# Patient Record
Sex: Male | Born: 1990 | Race: Black or African American | Hispanic: No | Marital: Single | Smoking: Former smoker
Health system: Southern US, Community
[De-identification: ages and names within clinical notes are randomized; demographics above are authoritative.]

## PROBLEM LIST (undated history)

## (undated) ENCOUNTER — Emergency Department (HOSPITAL_COMMUNITY): Admission: EM | Payer: Self-pay | Source: Home / Self Care

## (undated) DIAGNOSIS — M62838 Other muscle spasm: Secondary | ICD-10-CM

## (undated) DIAGNOSIS — R1310 Dysphagia, unspecified: Secondary | ICD-10-CM

## (undated) DIAGNOSIS — J40 Bronchitis, not specified as acute or chronic: Secondary | ICD-10-CM

## (undated) DIAGNOSIS — M75100 Unspecified rotator cuff tear or rupture of unspecified shoulder, not specified as traumatic: Secondary | ICD-10-CM

## (undated) DIAGNOSIS — K219 Gastro-esophageal reflux disease without esophagitis: Secondary | ICD-10-CM

## (undated) DIAGNOSIS — D649 Anemia, unspecified: Secondary | ICD-10-CM

## (undated) DIAGNOSIS — F419 Anxiety disorder, unspecified: Secondary | ICD-10-CM

## (undated) HISTORY — DX: Dysphagia, unspecified: R13.10

## (undated) HISTORY — DX: Other muscle spasm: M62.838

## (undated) HISTORY — PX: COLONOSCOPY: SHX174

## (undated) HISTORY — DX: Anemia, unspecified: D64.9

## (undated) HISTORY — DX: Anxiety disorder, unspecified: F41.9

---

## 2014-05-04 ENCOUNTER — Encounter (HOSPITAL_COMMUNITY): Payer: Self-pay | Admitting: Emergency Medicine

## 2014-05-04 ENCOUNTER — Emergency Department (INDEPENDENT_AMBULATORY_CARE_PROVIDER_SITE_OTHER)
Admission: EM | Admit: 2014-05-04 | Discharge: 2014-05-04 | Discharge status: Against Medical Advice | Payer: Self-pay | Source: Home / Self Care | Attending: Emergency Medicine | Admitting: Emergency Medicine

## 2014-05-04 ENCOUNTER — Emergency Department (INDEPENDENT_AMBULATORY_CARE_PROVIDER_SITE_OTHER): Payer: Self-pay

## 2014-05-04 DIAGNOSIS — R0789 Other chest pain: Secondary | ICD-10-CM

## 2014-05-04 DIAGNOSIS — R42 Dizziness and giddiness: Secondary | ICD-10-CM

## 2014-05-04 DIAGNOSIS — R002 Palpitations: Secondary | ICD-10-CM

## 2014-05-04 NOTE — ED Provider Notes (Signed)
CSN: [id number]     Arrival date & time 05/04/14  [id number] History   First MD Initiated Contact with Patient 05/04/14 0957     Chief Complaint  Patient presents with  . Chest Pain   (Consider location/radiation/quality/duration/timing/severity/associated sxs/prior Treatment) HPI Comments: [age]m presents for eval of chest pain. He reports a history of chest pain for about one month. The pain is located across his left chest and is constant. It is worse with taking a deep breath. In the past month, he has also had associated palpitations, shortness of breath, and dizziness. The dizziness, palpitations, and chest pain seemed to all go together. The chest pain is nonexertional. He has no previous history of heart problems and no contributory family history. He reports a history of chronic bronchitis. He admits to increased stress in his life which he believes may be causing this.  Patient is a 23 y.o. male presenting with chest pain.  Chest Pain Associated symptoms: dizziness, palpitations and shortness of breath   Associated symptoms: no nausea and not vomiting     History reviewed. No pertinent past medical history. History reviewed. No pertinent past surgical history. History reviewed. No pertinent family history. History  Substance Use Topics  . Smoking status: Former Games developermoker  . Smokeless tobacco: Not on file  . Alcohol Use: Yes    Review of Systems  Respiratory: Positive for shortness of breath. Negative for chest tightness and wheezing.   Cardiovascular: Positive for chest pain and palpitations. Negative for leg swelling.  Gastrointestinal: Negative for nausea, vomiting and diarrhea.  Neurological: Positive for dizziness and light-headedness.  All other systems reviewed and are negative.   Allergies  Review of patient's allergies indicates no known allergies.  Home Medications   Prior to Admission medications   Not on File   BP 111/73  Pulse 65  Temp(Src) 98.3 F (36.8 C)  (Oral)  Resp 16  SpO2 100% Physical Exam  Nursing note and vitals reviewed. Constitutional: He is oriented to person, place, and time. He appears well-developed and well-nourished. No distress.  Physically fit  HENT:  Head: Normocephalic.  Neck: No JVD present. No tracheal deviation present.  Cardiovascular: Normal rate, regular rhythm and normal heart sounds.   Pulmonary/Chest: Effort normal and breath sounds normal. No respiratory distress.  Neurological: He is alert and oriented to person, place, and time. Coordination normal.  Skin: Skin is warm and dry. No rash noted. He is not diaphoretic.  Psychiatric: He has a normal mood and affect. Judgment normal.    ED Course  ED EKG  Date/Time: 05/04/2014 10:50 AM Performed by: Autumn MessingBAKER, Takela Varden, H Authorized by: Autumn MessingBAKER, Oneka Parada, H Rhythm: sinus rhythm Rate: bradycardic QRS axis: normal Conduction: conduction normal ST Segments: ST segments normal T Waves: T waves normal Other: no other findings Clinical impression: normal ECG   (including critical care time) Labs Review Labs Reviewed - No data to display  Imaging Review Dg Chest 2 View  05/04/2014   CLINICAL DATA:  Chest pain  EXAM: CHEST  2 VIEW  COMPARISON:  None.  FINDINGS: The heart size and mediastinal contours are within normal limits. Both lungs are clear. The visualized skeletal structures are unremarkable.  IMPRESSION: No active cardiopulmonary disease.   Electronically Signed   By: Ruel Favorsrevor  Shick M.D.   On: 05/04/2014 10:26     MDM   1. Other chest pain   2. Palpitations   3. Dizziness     Chest pain with palpitations, nonexertional. Nonradiating without any nausea.  It is associated with mild shortness of breath. Although this may be caused by stress, cannot rule out intermittent arrhythmia. He needs monitoring, he has no insurance and would have difficulty with outpatient followup. Transferred to the ED via shuttle.  Graylon Good, PA-C 05/04/14 1052  After  the patient agreed to be transferred to the emergency department for further evaluation and monitoring, he walked outside and left. He will be dispositioned as AMA.  Graylon Good, PA-C 05/04/14 1105

## 2014-05-04 NOTE — ED Notes (Signed)
Pt  Reports     intermittant   Chest  Pain      X  1  Month     -    Worse  Sometimes  When  He  Takes  A  Deep  Breath        He        Reports  Is  A  Smoker   Who  Has  Had   chronic  Bronchitis  In  Past

## 2014-05-04 NOTE — ED Notes (Signed)
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

## 2014-05-04 NOTE — ED Provider Notes (Signed)
Medical screening examination/treatment/procedure(s) were performed by non-physician practitioner and as supervising physician I was immediately available for consultation/collaboration.  Leslee Homeavid Whitlee Sluder, M.D.  Reuben Likesavid C Milo Schreier, MD 05/04/14 [phone]

## 2015-02-04 ENCOUNTER — Emergency Department (HOSPITAL_COMMUNITY): Payer: Self-pay

## 2015-02-04 ENCOUNTER — Encounter (HOSPITAL_COMMUNITY): Payer: Self-pay | Admitting: *Deleted

## 2015-02-04 ENCOUNTER — Emergency Department (HOSPITAL_COMMUNITY)
Admission: EM | Admit: 2015-02-04 | Discharge: 2015-02-04 | Disposition: A | Discharge status: Home / Self Care | Payer: Self-pay | Attending: Emergency Medicine | Admitting: Emergency Medicine

## 2015-02-04 DIAGNOSIS — R059 Cough, unspecified: Secondary | ICD-10-CM

## 2015-02-04 DIAGNOSIS — R0789 Other chest pain: Secondary | ICD-10-CM | POA: Insufficient documentation

## 2015-02-04 DIAGNOSIS — Z87891 Personal history of nicotine dependence: Secondary | ICD-10-CM | POA: Insufficient documentation

## 2015-02-04 DIAGNOSIS — R6883 Chills (without fever): Secondary | ICD-10-CM | POA: Insufficient documentation

## 2015-02-04 DIAGNOSIS — Z79899 Other long term (current) drug therapy: Secondary | ICD-10-CM | POA: Insufficient documentation

## 2015-02-04 DIAGNOSIS — R05 Cough: Secondary | ICD-10-CM | POA: Insufficient documentation

## 2015-02-04 DIAGNOSIS — R0981 Nasal congestion: Secondary | ICD-10-CM | POA: Insufficient documentation

## 2015-02-04 DIAGNOSIS — R0602 Shortness of breath: Secondary | ICD-10-CM | POA: Insufficient documentation

## 2015-02-04 MED ORDER — GUAIFENESIN ER 600 MG PO TB12: 1200.0000 mg | ORAL_TABLET | Freq: Two times a day (BID) | ORAL | Status: DC

## 2015-02-04 MED ORDER — NAPROXEN 500 MG PO TABS: 500.0000 mg | ORAL_TABLET | Freq: Two times a day (BID) | ORAL | Status: DC

## 2015-02-04 NOTE — ED Notes (Signed)
Pt in c/o cough and congestion for two weeks, cough is productive with yellow mucus, c/o body aches and chills, headaches, reports nausea and one episode of vomiting

## 2015-02-04 NOTE — Discharge Instructions (Signed)
Chest Pain (Nonspecific) °It is often hard to give a diagnosis for the cause of chest pain. There is always a chance that your pain could be related to something serious, such as a heart attack or a blood clot in the lungs. You need to follow up with your doctor. °HOME CARE °· If antibiotic medicine was given, take it as directed by your doctor. Finish the medicine even if you start to feel better. °· For the next few days, avoid activities that bring on chest pain. Continue physical activities as told by your doctor. °· Do not use any tobacco products. This includes cigarettes, chewing tobacco, and e-cigarettes. °· Avoid drinking alcohol. °· Only take medicine as told by your doctor. °· Follow your doctor's suggestions for more testing if your chest pain does not go away. °· Keep all doctor visits you made. °GET HELP IF: °· Your chest pain does not go away, even after treatment. °· You have a rash with blisters on your chest. °· You have a fever. °GET HELP RIGHT AWAY IF:  °· You have more pain or pain that spreads to your arm, neck, jaw, back, or belly (abdomen). °· You have shortness of breath. °· You cough more than usual or cough up blood. °· You have very bad back or belly pain. °· You feel sick to your stomach (nauseous) or throw up (vomit). °· You have very bad weakness. °· You pass out (faint). °· You have chills. °This is an emergency. Do not wait to see if the problems will go away. Call your local emergency services (911 in [place] Do not drive yourself to the hospital. °MAKE SURE YOU:  °· Understand these instructions. °· Will watch your condition. °· Will get help right away if you are not doing well or get worse. °Document Released: 02/18/2008 Document Revised: 09/06/2013 Document Reviewed: 02/18/2008 °ExitCare® Patient Information ©2015 [employer]. This information is not intended to replace advice given to you by your health care provider. Make sure you discuss any questions you have with your  health care provider. ° °Cough, Adult ° A cough is a reflex that helps clear your throat and airways. It can help heal the body or may be a reaction to an irritated airway. A cough may only last 2 or 3 weeks (acute) or may last more than 8 weeks (chronic).  °CAUSES °Acute cough: °· Viral or bacterial infections. °Chronic cough: °· Infections. °· Allergies. °· Asthma. °· Post-nasal drip. °· Smoking. °· Heartburn or acid reflux. °· Some medicines. °· Chronic lung problems (COPD). °· Cancer. °SYMPTOMS  °· Cough. °· Fever. °· Chest pain. °· Increased breathing rate. °· High-pitched whistling sound when breathing (wheezing). °· Colored mucus that you cough up (sputum). °TREATMENT  °· A bacterial cough may be treated with antibiotic medicine. °· A viral cough must run its course and will not respond to antibiotics. °· Your caregiver may recommend other treatments if you have a chronic cough. °HOME CARE INSTRUCTIONS  °· Only take over-the-counter or prescription medicines for pain, discomfort, or fever as directed by your caregiver. Use cough suppressants only as directed by your caregiver. °· Use a cold steam vaporizer or humidifier in your bedroom or home to help loosen secretions. °· Sleep in a semi-upright position if your cough is worse at night. °· Rest as needed. °· Stop smoking if you smoke. °SEEK IMMEDIATE MEDICAL CARE IF:  °· You have pus in your sputum. °· Your cough starts to worsen. °· You cannot   control your cough with suppressants and are losing sleep. °· You begin coughing up blood. °· You have difficulty breathing. °· You develop pain which is getting worse or is uncontrolled with medicine. °· You have a fever. °MAKE SURE YOU:  °· Understand these instructions. °· Will watch your condition. °· Will get help right away if you are not doing well or get worse. °Document Released: 02/28/2011 Document Revised: 11/24/2011 Document Reviewed: 02/28/2011 °ExitCare® Patient Information ©2015 [employer]. This  information is not intended to replace advice given to you by your health care provider. Make sure you discuss any questions you have with your health care provider. ° °

## 2015-02-04 NOTE — ED Provider Notes (Signed)
CSN: [id number]     Arrival date & time 02/04/15  0218 History   This chart was scribed for Marisa Severin, MD by Abel Presto, ED Scribe. This patient was seen in room D33C/D33C and the patient's care was started at 3:52 AM.    Chief Complaint  Patient presents with  . Cough    The history is provided by the patient. No language interpreter was used.   HPI Comments: Sean Park is a 24 y.o. male who presents to the Emergency Department complaining of intermittent productive cough with thick yellow sputum with onset 2 weeks ago. Pt notes associated chills, SOB and congestion. Pt has not taken any decongestants for relief. Pt also reports recurrent burning, aching, sharp central chest pain with onset 7 months ago. Pt reports frequent strenuous work. Pt reports h/o chronic bronchitis but states he has not used an inhaler in years. Pt is not a smoker. Pt denies fever.   History reviewed. No pertinent past medical history. History reviewed. No pertinent past surgical history. History reviewed. No pertinent family history. History  Substance Use Topics  . Smoking status: Former Games developer  . Smokeless tobacco: Not on file  . Alcohol Use: Yes    Review of Systems  Constitutional: Positive for chills. Negative for fever.  HENT: Positive for congestion. Negative for sore throat.   Respiratory: Positive for cough and shortness of breath.   Cardiovascular: Positive for chest pain.     Allergies  Review of patient's allergies indicates no known allergies.  Home Medications   Prior to Admission medications   Medication Sig Start Date End Date Taking? Authorizing Provider  Ascorbic Acid (VITAMIN C PO) Take 1 tablet by mouth daily.   Yes Historical Provider, MD  COD LIVER OIL PO Take 1 capsule by mouth daily.   Yes Historical Provider, MD   BP 113/65 mmHg  Pulse 59  Temp(Src) 99.4 F (37.4 C) (Oral)  Resp 17  Wt 160 lb (72.576 kg)  SpO2 100% Physical Exam  Constitutional: He is  oriented to person, place, and time. He appears well-developed and well-nourished.  HENT:  Head: Normocephalic and atraumatic.  Nose: Nose normal.  Mouth/Throat: Oropharynx is clear and moist.  Eyes: Conjunctivae and EOM are normal. Pupils are equal, round, and reactive to light.  Neck: Normal range of motion. Neck supple. No JVD present. No tracheal deviation present. No thyromegaly present.  Cardiovascular: Normal rate, regular rhythm, normal heart sounds and intact distal pulses.  Exam reveals no gallop and no friction rub.   No murmur heard. Pulmonary/Chest: Effort normal and breath sounds normal. No stridor. No respiratory distress. He has no wheezes. He has no rales. He exhibits no tenderness.  Abdominal: Soft. Bowel sounds are normal. He exhibits no distension and no mass. There is no tenderness. There is no rebound and no guarding.  Musculoskeletal: Normal range of motion. He exhibits no edema or tenderness.  Lymphadenopathy:    He has no cervical adenopathy.  Neurological: He is alert and oriented to person, place, and time. He displays normal reflexes. He exhibits normal muscle tone. Coordination normal.  Skin: Skin is warm and dry. No rash noted. No erythema. No pallor.  Psychiatric: He has a normal mood and affect. His behavior is normal. Judgment and thought content normal.  Nursing note and vitals reviewed.   ED Course  Procedures (including critical care time) DIAGNOSTIC STUDIES: Oxygen Saturation is 100% on room air, normal by my interpretation.    COORDINATION OF CARE: 4:01  AM Discussed treatment plan with patient at beside, the patient agrees with the plan and has no further questions at this time.   Labs Review Labs Reviewed - No data to display  Imaging Review Dg Chest 2 View  02/04/2015   CLINICAL DATA:  Cough, shortness of breath and chest pain radiating to the left arm.  EXAM: CHEST  2 VIEW  COMPARISON:  05/04/2014  FINDINGS: The cardiomediastinal contours are  normal. The lungs are clear. Pulmonary vasculature is normal. No consolidation, pleural effusion, or pneumothorax. No acute osseous abnormalities are seen.  IMPRESSION: No acute pulmonary process.   Electronically Signed   By: Rubye OaksMelanie  Ehinger M.D.   On: 02/04/2015 04:20     EKG Interpretation None      MDM   Final diagnoses:  Cough  Other chest pain   I personally performed the services described in this documentation, which was scribed in my presence. The recorded information has been reviewed and is accurate.  24 year old male with 2 weeks of cough, subjective fever and chills.  Also complains of 7 months of chest pain.  No chest pain currently.  Chest x-ray unremarkable.  We'll start him on a decongestant and NSAID for pain.    Marisa Severinlga Rivka Baune, MD 02/04/15 [phone]

## 2015-02-16 ENCOUNTER — Emergency Department (HOSPITAL_COMMUNITY): Payer: Self-pay

## 2015-02-16 ENCOUNTER — Encounter (HOSPITAL_COMMUNITY): Payer: Self-pay | Admitting: *Deleted

## 2015-02-16 DIAGNOSIS — Z79899 Other long term (current) drug therapy: Secondary | ICD-10-CM | POA: Insufficient documentation

## 2015-02-16 DIAGNOSIS — R079 Chest pain, unspecified: Secondary | ICD-10-CM | POA: Insufficient documentation

## 2015-02-16 DIAGNOSIS — Z87891 Personal history of nicotine dependence: Secondary | ICD-10-CM | POA: Insufficient documentation

## 2015-02-16 DIAGNOSIS — R05 Cough: Secondary | ICD-10-CM | POA: Insufficient documentation

## 2015-02-16 DIAGNOSIS — R0602 Shortness of breath: Secondary | ICD-10-CM | POA: Insufficient documentation

## 2015-02-16 DIAGNOSIS — K5900 Constipation, unspecified: Secondary | ICD-10-CM | POA: Insufficient documentation

## 2015-02-16 DIAGNOSIS — Z8709 Personal history of other diseases of the respiratory system: Secondary | ICD-10-CM | POA: Insufficient documentation

## 2015-02-16 LAB — CBC WITH DIFFERENTIAL/PLATELET
Basophils Absolute: 0 10*3/uL (ref 0.0–0.1)
Basophils Relative: 1 % (ref 0–1)
EOS ABS: 0.3 10*3/uL (ref 0.0–0.7)
EOS PCT: 7 % — AB (ref 0–5)
HCT: 39.4 % (ref 39.0–52.0)
HEMOGLOBIN: 12.4 g/dL — AB (ref 13.0–17.0)
LYMPHS PCT: 48 % — AB (ref 12–46)
Lymphs Abs: 2.4 10*3/uL (ref 0.7–4.0)
MCH: 23.8 pg — AB (ref 26.0–34.0)
MCHC: 31.5 g/dL (ref 30.0–36.0)
MCV: 75.5 fL — ABNORMAL LOW (ref 78.0–100.0)
MONO ABS: 0.4 10*3/uL (ref 0.1–1.0)
MONOS PCT: 8 % (ref 3–12)
NEUTROS ABS: 1.8 10*3/uL (ref 1.7–7.7)
Neutrophils Relative %: 36 % — ABNORMAL LOW (ref 43–77)
PLATELETS: 188 10*3/uL (ref 150–400)
RBC: 5.22 MIL/uL (ref 4.22–5.81)
RDW: 12.8 % (ref 11.5–15.5)
WBC: 4.9 10*3/uL (ref 4.0–10.5)

## 2015-02-16 LAB — COMPREHENSIVE METABOLIC PANEL
ALK PHOS: 47 U/L (ref 38–126)
ALT: 16 U/L — ABNORMAL LOW (ref 17–63)
AST: 21 U/L (ref 15–41)
Albumin: 3.8 g/dL (ref 3.5–5.0)
Anion gap: 8 (ref 5–15)
BUN: 8 mg/dL (ref 6–20)
CO2: 29 mmol/L (ref 22–32)
CREATININE: 0.92 mg/dL (ref 0.61–1.24)
Calcium: 9 mg/dL (ref 8.9–10.3)
Chloride: 103 mmol/L (ref 101–111)
GFR calc Af Amer: >60 mL/min (ref >60)
GFR calc non Af Amer: >60 mL/min (ref >60)
Glucose, Bld: 93 mg/dL (ref 65–99)
Potassium: 3.7 mmol/L (ref 3.5–5.1)
Sodium: 140 mmol/L (ref 135–145)
Total Bilirubin: 0.5 mg/dL (ref 0.3–1.2)
Total Protein: 6.6 g/dL (ref 6.5–8.1)

## 2015-02-16 NOTE — ED Notes (Signed)
The pt is c/o chest pain he has had no bm for one week.  He describes the pain as a burning sensation in his chest

## 2015-02-17 ENCOUNTER — Emergency Department (HOSPITAL_COMMUNITY)
Admission: EM | Admit: 2015-02-17 | Discharge: 2015-02-17 | Disposition: A | Discharge status: Home / Self Care | Payer: Self-pay | Attending: Emergency Medicine | Admitting: Emergency Medicine

## 2015-02-17 DIAGNOSIS — R079 Chest pain, unspecified: Secondary | ICD-10-CM

## 2015-02-17 DIAGNOSIS — K5900 Constipation, unspecified: Secondary | ICD-10-CM

## 2015-02-17 MED ORDER — POLYETHYLENE GLYCOL 3350 17 G PO PACK: 17.0000 g | PACK | Freq: Every day | ORAL | Status: DC

## 2015-02-17 MED ORDER — GI COCKTAIL ~~LOC~~
30.0000 mL | Freq: Once | ORAL | Status: AC
  Administered 2015-02-17: 30 mL via ORAL
  Filled 2015-02-17: qty 30

## 2015-02-17 NOTE — ED Notes (Signed)
Pt c/o generalized chest pain for several days. Reports last normal bowel movement five days ago; constipation unrelieved with OTC medications and increased fluid intake. Denies shortness of breath

## 2015-02-17 NOTE — ED Provider Notes (Signed)
CSN: [id number]     Arrival date & time 02/16/15  2213 History  This chart was scribed for Sean Rhine, MD by Abel Presto, ED Scribe. This patient was seen in room A09C/A09C and the patient's care was started at 1:37 AM.    Chief Complaint  Patient presents with  . Chest Pain    Patient is a 24 y.o. male presenting with chest pain. The history is provided by the patient. No language interpreter was used.  Chest Pain Pain location:  Substernal area Pain quality: burning   Pain radiates to:  Does not radiate Pain radiates to the back: no   Pain severity:  Moderate Onset quality:  Sudden Duration:  1 day Timing:  Constant Progression:  Worsening Chronicity:  New Context: eating   Relieved by:  Nothing Associated symptoms: abdominal pain, cough and shortness of breath   Associated symptoms: no fever and not vomiting   Abdominal pain:    Location:  Generalized   Quality:  Burning   Severity:  Moderate   Onset quality:  Gradual   Duration:  1 week   Timing:  Intermittent   Progression:  Worsening   Chronicity:  New Risk factors: no prior DVT/PE    HPI Comments: Altair Appenzeller is a 24 y.o. male who presents to the Emergency Department complaining of upper chest pain with onset today. Pt notes eating aggravates the pain. He states he feels like gas is trapped in his chest. Pt reports assocaited burping with production of "brown chunks", burning abdominal pain, constipation for 1 week, and mild SOB secondary to pain. Pt has tried stool softener and GasX for relief. Pt reports h/o GERD for which he takes omeprazole. Pt also presents with mild cough.  Pt was last seen in ED for chest pain and cough on 02/04/15. Pt was d/c with Rx for guaifenesin and naproxen. Pt denies recent prolonged travel or surgery. Pt denies PMHx of DVT/ PE. Pt denies fever, hematochezia, bilateral LE swelling and pain, vomiting, and changes in flatulence.   PMH - none History  Substance Use Topics  . Smoking  status: Former Games developer  . Smokeless tobacco: Not on file  . Alcohol Use: Yes    Review of Systems  Constitutional: Negative for fever.  Respiratory: Positive for cough and shortness of breath.   Cardiovascular: Positive for chest pain. Negative for leg swelling.  Gastrointestinal: Positive for abdominal pain and constipation. Negative for vomiting and blood in stool.  All other systems reviewed and are negative.     Allergies  Review of patient's allergies indicates no known allergies.  Home Medications   Prior to Admission medications   Medication Sig Start Date End Date Taking? Authorizing Provider  Ascorbic Acid (VITAMIN C PO) Take 1 tablet by mouth daily.    Historical Provider, MD  COD LIVER OIL PO Take 1 capsule by mouth daily.    Historical Provider, MD  polyethylene glycol (MIRALAX / GLYCOLAX) packet Take 17 g by mouth daily. 02/17/15   Sean Rhine, MD   BP 104/60 mmHg  Pulse 74  Temp(Src) 98.6 F (37 C) (Oral)  Resp 22  Ht 6' (1.829 m)  Wt 145 lb (65.772 kg)  BMI 19.66 kg/m2  SpO2 99% Physical Exam  Nursing note and vitals reviewed.  CONSTITUTIONAL: Well developed/well nourished HEAD: Normocephalic/atraumatic EYES: EOMI/PERRL ENMT: Mucous membranes moist NECK: supple no meningeal signs SPINE/BACK:entire spine nontender CV: S1/S2 noted, no murmurs/rubs/gallops noted LUNGS: Lungs are clear to auscultation bilaterally, no apparent distress  ABDOMEN: soft, nontender, no rebound or guarding, bowel sounds noted throughout abdomen GU:no cva tenderness NEURO: Pt is awake/alert/appropriate, moves all extremitiesx4.  No facial droop.   EXTREMITIES: pulses normal/equal, full ROM; no edema or calf tenderness noted SKIN: warm, color normal PSYCH: no abnormalities of mood noted, alert and oriented to situation   ED Course  Procedures  Pt without signs of bowel obstruction No focal abd tenderness He reports significant constipation,l ordered miralax For CP - seems  related to eating Low suspicion for ACS/Pe/Dissection at this time  Medications  gi cocktail (Maalox,Lidocaine,Donnatal) (30 mLs Oral Given 02/17/15 0151)    DIAGNOSTIC STUDIES: Oxygen Saturation is 99% on room air, normal by my interpretation.    COORDINATION OF CARE: 1:46 AM Discussed treatment plan with patient at beside, the patient agrees with the plan and has no further questions at this time.   Labs Review Labs Reviewed  CBC WITH DIFFERENTIAL/PLATELET - Abnormal; Notable for the following:    Hemoglobin 12.4 (*)    MCV 75.5 (*)    MCH 23.8 (*)    Neutrophils Relative % 36 (*)    Lymphocytes Relative 48 (*)    Eosinophils Relative 7 (*)    All other components within normal limits  COMPREHENSIVE METABOLIC PANEL - Abnormal; Notable for the following:    ALT 16 (*)    All other components within normal limits    Imaging Review Dg Chest 2 View  02/16/2015   CLINICAL DATA:  Chest pain and shortness of breath. Symptoms for 1 week.  EXAM: CHEST  2 VIEW  COMPARISON:  02/04/2015  FINDINGS: The cardiomediastinal contours are normal. The lungs are clear. Pulmonary vasculature is normal. No consolidation, pleural effusion, or pneumothorax. No acute osseous abnormalities are seen.  IMPRESSION: No acute pulmonary process.  No change from prior exam.   Electronically Signed   By: Rubye OaksMelanie  Ehinger M.D.   On: 02/16/2015 23:20     EKG Interpretation   Date/Time:  Friday February 16 2015 22:26:19 EDT Ventricular Rate:  55 PR Interval:  172 QRS Duration: 82 QT Interval:  426 QTC Calculation: 407 R Axis:   77 Text Interpretation:  Sinus bradycardia Otherwise normal ECG No  significant change was found Confirmed by CAMPOS  MD, Caryn BeeKEVIN ([id number]) on  02/16/2015 11:54:36 PM      MDM   Final diagnoses:  Chest pain, unspecified chest pain type  Constipation, unspecified constipation type    Nursing notes including past medical history and social history reviewed and considered in  documentation xrays/imaging reviewed by myself and considered during evaluation  I personally performed the services described in this documentation, which was scribed in my presence. The recorded information has been reviewed and is accurate.        Sean Rhineonald Laranda Burkemper, MD 02/17/15 [phone]

## 2015-02-17 NOTE — Discharge Instructions (Signed)

## 2015-02-19 ENCOUNTER — Encounter (HOSPITAL_COMMUNITY): Payer: Self-pay

## 2015-02-19 ENCOUNTER — Emergency Department (HOSPITAL_COMMUNITY)
Admission: EM | Admit: 2015-02-19 | Discharge: 2015-02-19 | Disposition: A | Discharge status: Home / Self Care | Payer: Self-pay | Attending: Emergency Medicine | Admitting: Emergency Medicine

## 2015-02-19 ENCOUNTER — Emergency Department (HOSPITAL_COMMUNITY): Payer: Self-pay

## 2015-02-19 DIAGNOSIS — J4521 Mild intermittent asthma with (acute) exacerbation: Secondary | ICD-10-CM | POA: Insufficient documentation

## 2015-02-19 DIAGNOSIS — K219 Gastro-esophageal reflux disease without esophagitis: Secondary | ICD-10-CM | POA: Insufficient documentation

## 2015-02-19 DIAGNOSIS — Z87891 Personal history of nicotine dependence: Secondary | ICD-10-CM | POA: Insufficient documentation

## 2015-02-19 DIAGNOSIS — J4520 Mild intermittent asthma, uncomplicated: Secondary | ICD-10-CM

## 2015-02-19 DIAGNOSIS — Z79899 Other long term (current) drug therapy: Secondary | ICD-10-CM | POA: Insufficient documentation

## 2015-02-19 HISTORY — DX: Bronchitis, not specified as acute or chronic: J40

## 2015-02-19 LAB — CBC
HCT: 39.5 % (ref 39.0–52.0)
Hemoglobin: 12.9 g/dL — ABNORMAL LOW (ref 13.0–17.0)
MCH: 24.3 pg — ABNORMAL LOW (ref 26.0–34.0)
MCHC: 32.7 g/dL (ref 30.0–36.0)
MCV: 74.4 fL — AB (ref 78.0–100.0)
Platelets: 183 10*3/uL (ref 150–400)
RBC: 5.31 MIL/uL (ref 4.22–5.81)
RDW: 12.8 % (ref 11.5–15.5)
WBC: 5.7 10*3/uL (ref 4.0–10.5)

## 2015-02-19 LAB — BASIC METABOLIC PANEL
ANION GAP: 8 (ref 5–15)
BUN: 7 mg/dL (ref 6–20)
CO2: 25 mmol/L (ref 22–32)
Calcium: 8.8 mg/dL — ABNORMAL LOW (ref 8.9–10.3)
Chloride: 104 mmol/L (ref 101–111)
Creatinine, Ser: 0.97 mg/dL (ref 0.61–1.24)
GFR calc Af Amer: >60 mL/min (ref >60)
GFR calc non Af Amer: >60 mL/min (ref >60)
GLUCOSE: 102 mg/dL — AB (ref 65–99)
Potassium: 3.4 mmol/L — ABNORMAL LOW (ref 3.5–5.1)
SODIUM: 137 mmol/L (ref 135–145)

## 2015-02-19 LAB — I-STAT TROPONIN, ED: Troponin i, poc: 0.01 ng/mL (ref 0.00–0.08)

## 2015-02-19 MED ORDER — ALBUTEROL SULFATE HFA 108 (90 BASE) MCG/ACT IN AERS
2.0000 | INHALATION_SPRAY | RESPIRATORY_TRACT | Status: DC | PRN
  Administered 2015-02-19: 2 via RESPIRATORY_TRACT
  Filled 2015-02-19: qty 6.7

## 2015-02-19 MED ORDER — SUCRALFATE 1 G PO TABS: 1.0000 g | ORAL_TABLET | Freq: Three times a day (TID) | ORAL | Status: DC

## 2015-02-19 MED ORDER — IPRATROPIUM-ALBUTEROL 0.5-2.5 (3) MG/3ML IN SOLN: 3.0000 mL | Freq: Once | RESPIRATORY_TRACT | Status: DC

## 2015-02-19 MED ORDER — GI COCKTAIL ~~LOC~~
30.0000 mL | Freq: Once | ORAL | Status: AC
  Administered 2015-02-19: 30 mL via ORAL
  Filled 2015-02-19: qty 30

## 2015-02-19 NOTE — Discharge Instructions (Signed)
Asthma °Asthma is a condition of the lungs in which the airways tighten and narrow. Asthma can make it hard to breathe. Asthma cannot be cured, but medicine and lifestyle changes can help control it. Asthma may be started (triggered) by: °· Animal skin flakes (dander). °· Dust. °· Cockroaches. °· Pollen. °· Mold. °· Smoke. °· Cleaning products. °· Hair sprays or aerosol sprays. °· Paint fumes or strong smells. °· Cold air, weather changes, and winds. °· Crying or laughing hard. °· Stress. °· Certain medicines or drugs. °· Foods, such as dried fruit, potato chips, and sparkling grape juice. °· Infections or conditions (colds, flu). °· Exercise. °· Certain medical conditions or diseases. °· Exercise or tiring activities. °HOME CARE  °· Take medicine as told by your doctor. °· Use a peak flow meter as told by your doctor. A peak flow meter is a tool that measures how well the lungs are working. °· Record and keep track of the peak flow meter's readings. °· Understand and use the asthma action plan. An asthma action plan is a written plan for taking care of your asthma and treating your attacks. °· To help prevent asthma attacks: °¨ Do not smoke. Stay away from secondhand smoke. °¨ Change your heating and air conditioning filter often. °¨ Limit your use of fireplaces and wood stoves. °¨ Get rid of pests (such as roaches and mice) and their droppings. °¨ Throw away plants if you see mold on them. °¨ Clean your floors. Dust regularly. Use cleaning products that do not smell. °¨ Have someone vacuum when you are not home. Use a vacuum cleaner with a HEPA filter if possible. °¨ Replace carpet with wood, tile, or vinyl flooring. Carpet can trap animal skin flakes and dust. °¨ Use allergy-proof pillows, mattress covers, and box spring covers. °¨ Wash bed sheets and blankets every week in hot water and dry them in a dryer. °¨ Use blankets that are made of polyester or cotton. °¨ Clean bathrooms and kitchens with bleach. If  possible, have someone repaint the walls in these rooms with mold-resistant paint. Keep out of the rooms that are being cleaned and painted. °¨ Wash hands often. °GET HELP IF: °· You have make a whistling sound when breaking (wheeze), have shortness of breath, or have a cough even if taking medicine to prevent attacks. °· The colored mucus you cough up (sputum) is thicker than usual. °· The colored mucus you cough up changes from clear or white to yellow, green, gray, or bloody. °· You have problems from the medicine you are taking such as: °¨ A rash. °¨ Itching. °¨ Swelling. °¨ Trouble breathing. °· You need reliever medicines more than 2-3 times a week. °· Your peak flow measurement is still at 50-79% of your personal best after following the action plan for 1 hour. °· You have a fever. °GET HELP RIGHT AWAY IF:  °· You seem to be worse and are not responding to medicine during an asthma attack. °· You are short of breath even at rest. °· You get short of breath when doing very little activity. °· You have trouble eating, drinking, or talking. °· You have chest pain. °· You have a fast heartbeat. °· Your lips or fingernails start to turn blue. °· You are light-headed, dizzy, or faint. °· Your peak flow is less than 50% of your personal best. °MAKE SURE YOU:  °· Understand these instructions. °· Will watch your condition. °· Will get help right away if you   are not doing well or get worse. Document Released: 02/18/2008 Document Revised: 01/16/2014 Document Reviewed: 03/31/2013 [facility] Patient Information 2015 [place], [place]. This information is not intended to replace advice given to you by your health care provider. Make sure you discuss any questions you have with your health care provider. Been provided with an inhaler uses as needed.  You've also been given a prescription for medication called Carafate.  Please take this as directed.  30 minutes before meals and 30 minutes before bedtime.  You've also been  given a referral to gastroenterology.  Please call and make an appointment for further evaluation

## 2015-02-19 NOTE — ED Provider Notes (Signed)
CSN: [id number]     Arrival date & time 02/19/15  0329 History   First MD Initiated Contact with Patient 02/19/15 0404     Chief Complaint  Patient presents with  . Chest Pain     (Consider location/radiation/quality/duration/timing/severity/associated sxs/prior Treatment) HPI Comments: This is a 24 year old male who spends seen several times in the emergency department for chest pain that wakes him from sleep and severe in nature.  It's a burning, per his description.  He told me he has no history of gastric reflux, but he is been prescribed Prilosec in the past.  He states it feels like gas is trapped his epigastric tenderness and burning sensation that comes up into his throat.  Patient is a 24 y.o. male presenting with chest pain. The history is provided by the patient.  Chest Pain Pain location:  Substernal area Pain quality: aching and burning   Pain radiates to the back: no   Pain severity:  Severe Onset quality:  Sudden Timing:  Constant Progression:  Worsening Chronicity:  Recurrent Context: at rest   Relieved by:  Nothing Worsened by:  Nothing tried Ineffective treatments:  None tried Associated symptoms: abdominal pain, anxiety, heartburn and shortness of breath   Associated symptoms: no cough, no diaphoresis, no dysphagia, no fever, no lower extremity edema, no nausea, no near-syncope, not vomiting and no weakness   Abdominal pain:    Location:  Epigastric   Quality:  Burning and bloating   Severity:  Severe   Chronicity:  Recurrent Risk factors: male sex     Past Medical History  Diagnosis Date  . Bronchitis    History reviewed. No pertinent past surgical history. History reviewed. No pertinent family history. History  Substance Use Topics  . Smoking status: Former Games developermoker  . Smokeless tobacco: Not on file  . Alcohol Use: Yes     Comment: former    Review of Systems  Constitutional: Negative for fever and diaphoresis.  HENT: Negative for trouble  swallowing.   Respiratory: Positive for chest tightness and shortness of breath. Negative for cough.   Cardiovascular: Positive for chest pain. Negative for near-syncope.  Gastrointestinal: Positive for heartburn and abdominal pain. Negative for nausea, vomiting, constipation and abdominal distention.  Neurological: Negative for weakness.      Allergies  Review of patient's allergies indicates no known allergies.  Home Medications   Prior to Admission medications   Medication Sig Start Date End Date Taking? Authorizing Provider  Ascorbic Acid (VITAMIN C PO) Take 1 tablet by mouth daily.    Historical Provider, MD  COD LIVER OIL PO Take 1 capsule by mouth daily.    Historical Provider, MD  polyethylene glycol (MIRALAX / GLYCOLAX) packet Take 17 g by mouth daily. 02/17/15   Zadie Rhineonald Wickline, MD  sucralfate (CARAFATE) 1 G tablet Take 1 tablet (1 g total) by mouth 4 (four) times daily -  with meals and at bedtime. 02/19/15   Earley FavorGail Meenakshi Sazama, NP   BP 126/73 mmHg  Pulse 55  Temp(Src) 97.9 F (36.6 C) (Oral)  Resp 18  Ht 6' (1.829 m)  Wt 145 lb (65.772 kg)  BMI 19.66 kg/m2  SpO2 98% Physical Exam  Constitutional: He is oriented to person, place, and time. He appears well-developed and well-nourished. He appears distressed.  HENT:  Head: Normocephalic.  Right Ear: External ear normal.  Left Ear: External ear normal.  Eyes: Pupils are equal, round, and reactive to light.  Neck: Normal range of motion.  Cardiovascular: Normal  rate and regular rhythm.   Pulmonary/Chest: Effort normal and breath sounds normal.  Abdominal: Soft. There is tenderness in the epigastric area. There is no rigidity and no guarding.  Musculoskeletal: Normal range of motion.  Neurological: He is alert and oriented to person, place, and time.  Skin: Skin is warm and dry.  Nursing note and vitals reviewed.   ED Course  Procedures (including critical care time) Labs Review Labs Reviewed  BASIC METABOLIC PANEL -  Abnormal; Notable for the following:    Potassium 3.4 (*)    Glucose, Bld 102 (*)    Calcium 8.8 (*)    All other components within normal limits  CBC - Abnormal; Notable for the following:    Hemoglobin 12.9 (*)    MCV 74.4 (*)    MCH 24.3 (*)    All other components within normal limits  I-STAT TROPOININ, ED    Imaging Review Dg Chest 2 View (if Patient Has Fever And/or Copd)  02/19/2015   CLINICAL DATA:  Chest pain, onset tonight.  EXAM: CHEST  2 VIEW  COMPARISON:  02/16/2015  FINDINGS: The cardiomediastinal contours are normal. Mild hyperinflation, increased from prior. Pulmonary vasculature is normal. No consolidation, pleural effusion, or pneumothorax. No acute osseous abnormalities are seen.  IMPRESSION: Mild hyperinflation, can be seen with bronchitis or asthma. No localizing process.   Electronically Signed   By: Rubye Oaks M.D.   On: 02/19/2015 04:31     EKG Interpretation None     Reviewed labs from 63 without any change.  I will obtain labs tonight for comparison, although I do not believe that this is gallbladder disease nor cardiac disease, but most likely, GERD complicated by  anxiety MDM   Final diagnoses:  Gastroesophageal reflux disease, esophagitis presence not specified  Asthma, mild intermittent, uncomplicated         Earley Favor, NP 02/19/15 2004  Marisa Severin, MD 02/20/15 1435

## 2015-02-19 NOTE — ED Notes (Signed)
Pt stable, ambulatory, states understanding of discharge instructions, family at bedside. 

## 2015-02-19 NOTE — ED Notes (Signed)
Pt here for chest pain, onset tonight, pt reports feels like trapped gas and recent issues with constipation. sts work him from sleep.

## 2015-02-27 ENCOUNTER — Emergency Department (HOSPITAL_COMMUNITY): Payer: Self-pay

## 2015-02-27 ENCOUNTER — Emergency Department (HOSPITAL_COMMUNITY)
Admission: EM | Admit: 2015-02-27 | Discharge: 2015-02-27 | Disposition: A | Discharge status: Home / Self Care | Payer: Self-pay | Attending: Emergency Medicine | Admitting: Emergency Medicine

## 2015-02-27 ENCOUNTER — Encounter (HOSPITAL_COMMUNITY): Payer: Self-pay

## 2015-02-27 DIAGNOSIS — F419 Anxiety disorder, unspecified: Secondary | ICD-10-CM | POA: Insufficient documentation

## 2015-02-27 DIAGNOSIS — R0602 Shortness of breath: Secondary | ICD-10-CM | POA: Insufficient documentation

## 2015-02-27 DIAGNOSIS — Z87891 Personal history of nicotine dependence: Secondary | ICD-10-CM | POA: Insufficient documentation

## 2015-02-27 DIAGNOSIS — R0789 Other chest pain: Secondary | ICD-10-CM | POA: Insufficient documentation

## 2015-02-27 DIAGNOSIS — K219 Gastro-esophageal reflux disease without esophagitis: Secondary | ICD-10-CM | POA: Insufficient documentation

## 2015-02-27 DIAGNOSIS — Z79899 Other long term (current) drug therapy: Secondary | ICD-10-CM | POA: Insufficient documentation

## 2015-02-27 DIAGNOSIS — Z8709 Personal history of other diseases of the respiratory system: Secondary | ICD-10-CM | POA: Insufficient documentation

## 2015-02-27 HISTORY — DX: Gastro-esophageal reflux disease without esophagitis: K21.9

## 2015-02-27 LAB — BASIC METABOLIC PANEL
ANION GAP: 7 (ref 5–15)
BUN: 14 mg/dL (ref 6–20)
CHLORIDE: 103 mmol/L (ref 101–111)
CO2: 28 mmol/L (ref 22–32)
Calcium: 9.2 mg/dL (ref 8.9–10.3)
Creatinine, Ser: 1.15 mg/dL (ref 0.61–1.24)
GFR calc Af Amer: >60 mL/min (ref >60)
GFR calc non Af Amer: >60 mL/min (ref >60)
Glucose, Bld: 95 mg/dL (ref 65–99)
POTASSIUM: 3.7 mmol/L (ref 3.5–5.1)
Sodium: 138 mmol/L (ref 135–145)

## 2015-02-27 LAB — CBC
HCT: 40.7 % (ref 39.0–52.0)
Hemoglobin: 13.1 g/dL (ref 13.0–17.0)
MCH: 24.2 pg — ABNORMAL LOW (ref 26.0–34.0)
MCHC: 32.2 g/dL (ref 30.0–36.0)
MCV: 75.2 fL — AB (ref 78.0–100.0)
Platelets: 216 10*3/uL (ref 150–400)
RBC: 5.41 MIL/uL (ref 4.22–5.81)
RDW: 12.9 % (ref 11.5–15.5)
WBC: 4.7 10*3/uL (ref 4.0–10.5)

## 2015-02-27 LAB — D-DIMER, QUANTITATIVE: D-Dimer, Quant: <0.27 ug/mL-FEU (ref 0.00–0.48)

## 2015-02-27 LAB — I-STAT TROPONIN, ED: TROPONIN I, POC: 0 ng/mL (ref 0.00–0.08)

## 2015-02-27 MED ORDER — CYCLOBENZAPRINE HCL 5 MG PO TABS: 5.0000 mg | ORAL_TABLET | Freq: Three times a day (TID) | ORAL | Status: DC | PRN

## 2015-02-27 MED ORDER — CYCLOBENZAPRINE HCL 10 MG PO TABS
5.0000 mg | ORAL_TABLET | Freq: Once | ORAL | Status: AC
  Administered 2015-02-27: 5 mg via ORAL
  Filled 2015-02-27: qty 1

## 2015-02-27 MED ORDER — ESOMEPRAZOLE MAGNESIUM 40 MG PO CPDR
40.0000 mg | DELAYED_RELEASE_CAPSULE | Freq: Every day | ORAL | Status: DC
Start: 2015-02-27 — End: 2015-04-13

## 2015-02-27 NOTE — Discharge Instructions (Signed)
Continue sucralfate.   Take nexium daily as well.   Take tylenol for pain.   Take flexeril for muscle spasms. Do NOT drive with it.  [facility]   Return to ER if you have severe chest pain, anxiety, shortness of breath.
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
## 2015-02-27 NOTE — ED Provider Notes (Signed)
CSN: [id number]     Arrival date & time 02/27/15  1523 History   First MD Initiated Contact with Patient 02/27/15 1810     Chief Complaint  Patient presents with  . Chest Pain     (Consider location/radiation/quality/duration/timing/severity/associated sxs/prior Treatment) The history is provided by the patient.  Storme Lien is a 24 y.o. male hx of GERD here with chest pain. Patient has been having intermittent chest pain for the last several weeks. Today the chest pain got suddenly worse and had some left arm pain. Has some shortness of breath associated with it as well. Denies any abdominal pain. Of note patient hasn't seen in the ED several times for this problem and was diagnosed with reflux. He is taking Carafate currently but is not helping. Patient has been very anxious recently but denies thoughts of harming himself or others. He states that he has been under a lot of stress.   Past Medical History  Diagnosis Date  . Bronchitis   . GERD (gastroesophageal reflux disease)    History reviewed. No pertinent past surgical history. History reviewed. No pertinent family history. History  Substance Use Topics  . Smoking status: Former Games developer  . Smokeless tobacco: Not on file  . Alcohol Use: No    Review of Systems  Respiratory: Positive for shortness of breath.   Cardiovascular: Positive for chest pain.  All other systems reviewed and are negative.     Allergies  Review of patient's allergies indicates no known allergies.  Home Medications   Prior to Admission medications   Medication Sig Start Date End Date Taking? Authorizing Provider  albuterol (PROVENTIL HFA;VENTOLIN HFA) 108 (90 BASE) MCG/ACT inhaler Inhale 2 puffs into the lungs every 6 (six) hours as needed for wheezing or shortness of breath.   Yes Historical Provider, MD  Ascorbic Acid (VITAMIN C PO) Take 1 tablet by mouth daily.   Yes Historical Provider, MD  polyethylene glycol (MIRALAX / GLYCOLAX) packet  Take 17 g by mouth daily. Patient taking differently: Take 17 g by mouth daily as needed for moderate constipation.  02/17/15  Yes Zadie Rhine, MD  sucralfate (CARAFATE) 1 G tablet Take 1 tablet (1 g total) by mouth 4 (four) times daily -  with meals and at bedtime. 02/19/15  Yes Earley Favor, NP   BP 110/53 mmHg  Pulse 55  Temp(Src) 98.7 F (37.1 C) (Oral)  Resp 16  Ht 6' (1.829 m)  Wt 147 lb 8 oz (66.906 kg)  BMI 20.00 kg/m2  SpO2 100% Physical Exam  Constitutional: He is oriented to person, place, and time.  Anxious   HENT:  Head: Normocephalic.  Mouth/Throat: Oropharynx is clear and moist.  Eyes: Conjunctivae are normal. Pupils are equal, round, and reactive to light.  Neck: Normal range of motion. Neck supple.  Cardiovascular: Normal rate, regular rhythm and normal heart sounds.   Pulmonary/Chest: Effort normal and breath sounds normal. No respiratory distress. He has no wheezes. He has no rales.  Reproducible L upper chest tenderness   Abdominal: Soft. Bowel sounds are normal. He exhibits no distension. There is no tenderness. There is no rebound.  Musculoskeletal: Normal range of motion. He exhibits no edema or tenderness.  Neurological: He is alert and oriented to person, place, and time. No cranial nerve deficit. Coordination normal.  Skin: Skin is warm and dry.  Psychiatric: He has a normal mood and affect. His behavior is normal. Judgment and thought content normal.  Nursing note and vitals reviewed.  ED Course  Procedures (including critical care time) Labs Review Labs Reviewed  CBC - Abnormal; Notable for the following:    MCV 75.2 (*)    MCH 24.2 (*)    All other components within normal limits  BASIC METABOLIC PANEL  D-DIMER, QUANTITATIVE (NOT AT [facility])  Rosezena Sensor, ED    Imaging Review Dg Chest 2 View  02/27/2015   CLINICAL DATA:  Three-week history of chest pain and intermittent shortness of breath  EXAM: CHEST  2 VIEW  COMPARISON:  February 19, 2015   FINDINGS: There is no edema or consolidation. Heart size and pulmonary vascularity are normal. No adenopathy. No bone lesions.  IMPRESSION: No edema or consolidation.   Electronically Signed   By: Bretta Bang III M.D.   On: 02/27/2015 16:21     EKG Interpretation None      MDM   Final diagnoses:  None    Brondon Wann is a 24 y.o. male here with chest pain. Likely anxiety related. But has SOB so will get d-dimer. Pain for weeks so trop x 1 sufficient. Also pain is reproducible so likely MSK.  8:30 PM D-dimer neg. CXR and labs unremarkable. Reassured patient. Will dc with flexeril for muscle strain. Will add nexium for reflux. Had case management see patient and [facility]. I emphasize that he needs a good PMD to follow with.     Richardean Canal, MD 02/27/15 [phone]

## 2015-03-02 ENCOUNTER — Encounter: Payer: Self-pay | Admitting: Family Medicine

## 2015-03-02 ENCOUNTER — Ambulatory Visit: Payer: Self-pay | Attending: Family Medicine | Admitting: Family Medicine

## 2015-03-02 VITALS — BP 111/67 | HR 81 | Temp 98.2°F | Resp 18 | Ht 72.0 in | Wt 151.0 lb

## 2015-03-02 DIAGNOSIS — Z7189 Other specified counseling: Secondary | ICD-10-CM

## 2015-03-02 DIAGNOSIS — K2100 Gastro-esophageal reflux disease with esophagitis, without bleeding: Secondary | ICD-10-CM

## 2015-03-02 DIAGNOSIS — K219 Gastro-esophageal reflux disease without esophagitis: Secondary | ICD-10-CM | POA: Insufficient documentation

## 2015-03-02 DIAGNOSIS — Z7689 Persons encountering health services in other specified circumstances: Secondary | ICD-10-CM

## 2015-03-02 NOTE — Progress Notes (Signed)
ASSESSMENT: Pt currently experiencing symptoms of depression and anxiety. Pt needs to establish care with PCP. Pt would benefit from F/U with [facility], as well as psychoeducation and supportive counseling regarding coping with symptoms of depression and anxiety.  Stage of Change: contemplative  PLAN: 1. F/U with behavioral health consultant in as needed 2. Psychiatric Medications: none. 3. Behavioral recommendation(s):   -Consider reading through educational material regarding coping with symptoms of depression and anxiety -Consider F/U to put together wellness plan -Make an appointment with [facility] at [facility] SUBJECTIVE: Pt. referred by Concepcion Living, NP for symptoms of depression and anxiety:  Pt. here for referral regarding symptoms of depression and anxiety.  Pt. reports the following symptoms/concerns: Pt states that he was on Celexa over 15 years ago, and has not felt symptoms of depression again until recent months, as his health has worsened. Pt says that he used to be more active, and staying busy has kept him feeling better.  Duration of problem: >two months Severity: moderate  OBJECTIVE: Orientation & Cognition: Oriented x3. Thought processes normal and appropriate to situation. Mood: appropriate. Affect: appropriate Appearance: appropriate Risk of harm to self or others: no risk of harm to self or others Substance use: none Psychiatric medication use: Unchanged from prior contact. Assessments administered: PSQ9-14/ GAD7-17  Diagnosis: Generalized anxiety disorder/ Major depressive disorder, recurrent, unspecified CPT Code: F41.1/ F33.9 -------------------------------------------- Other(s) present in the room: friend  Time spent with patient in exam room: 20 minutes
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
## 2015-03-02 NOTE — Progress Notes (Signed)
Patient here for ED follow up for chest pain.Patient reports feeling fine. Patient reports a little bit of pressure currently in chest. Patient reports that it could be due to gas. Patient denies pain, but says that his stomach sort of burns but states it is most likely due to GERD.   Patient reports that he has no more refills on flexiril and needs a refill.

## 2015-03-02 NOTE — Patient Instructions (Signed)
You had bloodwork in the ED that does not show any problem. You do not need additional bloodwork at this time. Take Nexium twice a day over the week-end then back to once a day Make an appointment with assigned primary provider in 2 weeks.

## 2015-03-08 ENCOUNTER — Ambulatory Visit: Payer: Self-pay | Attending: Internal Medicine

## 2015-03-13 ENCOUNTER — Ambulatory Visit: Payer: Self-pay | Attending: Family Medicine | Admitting: Family Medicine

## 2015-03-13 ENCOUNTER — Other Ambulatory Visit: Payer: Self-pay

## 2015-03-13 VITALS — BP 120/71 | HR 77 | Temp 98.6°F | Resp 18

## 2015-03-13 DIAGNOSIS — F411 Generalized anxiety disorder: Secondary | ICD-10-CM

## 2015-03-13 DIAGNOSIS — K2100 Gastro-esophageal reflux disease with esophagitis, without bleeding: Secondary | ICD-10-CM

## 2015-03-13 DIAGNOSIS — F419 Anxiety disorder, unspecified: Secondary | ICD-10-CM

## 2015-03-13 DIAGNOSIS — F329 Major depressive disorder, single episode, unspecified: Secondary | ICD-10-CM | POA: Insufficient documentation

## 2015-03-13 DIAGNOSIS — R0789 Other chest pain: Secondary | ICD-10-CM

## 2015-03-13 LAB — TSH: TSH: 0.613 u[IU]/mL (ref 0.350–4.500)

## 2015-03-13 MED ORDER — ESCITALOPRAM OXALATE 10 MG PO TABS: 10.0000 mg | ORAL_TABLET | Freq: Every day | ORAL | Status: DC

## 2015-03-13 MED ORDER — GI COCKTAIL ~~LOC~~
30.0000 mL | Freq: Once | ORAL | Status: AC
  Administered 2015-03-13: 30 mL via ORAL

## 2015-03-13 MED ORDER — HYDROXYZINE HCL 25 MG PO TABS: 25.0000 mg | ORAL_TABLET | Freq: Three times a day (TID) | ORAL | Status: DC | PRN

## 2015-03-13 NOTE — Progress Notes (Addendum)
   Subjective:    Patient ID: Sean Park, male    DOB: 05/24/1991, 24 y.o.   MRN: [id number] CC: chest pain  HPI 24 yo M with hx of chest pain since 2015, seen in ED 5 times for CP since 04/2014. Presents as a walk in with complaint of chest pressure 6/10 and L arm numbness that started roughly 1 hr ago. Patient reports anxiety symptoms. Has been on celexa in the past. Anxiety has worsened over the past year. Stressors include work ( works in [employer]), home stressful with his girlfriend. Concerned about the health of his dad. He denies substance use or abuse.   Soc Hx: non smoker  Review of Systems  Constitutional: Negative for fever and chills.  Respiratory: Negative for cough and shortness of breath.   Cardiovascular: Positive for chest pain. Negative for palpitations and leg swelling.  Neurological: Positive for light-headedness and numbness. Negative for dizziness.  Psychiatric/Behavioral: The patient is nervous/anxious.    GAD-7: core of 12. 1-5 and 7. 20104. 3-6.      Objective:   Physical Exam BP 120/71 mmHg  Pulse 77  Temp(Src) 98.6 F (37 C) (Oral)  Resp 18  SpO2 97% General appearance: alert, cooperative and no distress  Nose: swollen nasal turbinates Throat: normal oropharynx Neck: supple, non tender, no thyromegaly  Lungs: clear to auscultation bilaterally Heart: regular rate and rhythm, S1, S2 normal, no murmur, click, rub or gallop Extremities: extremities normal, atraumatic, no cyanosis or edema  EKG: normal EKG, normal sinus rhythm.     Assessment & Plan:

## 2015-03-13 NOTE — Patient Instructions (Addendum)
Mr. Sean Park,   Thank you for coming in today. It was a pleasure meeting you. I look forward to being your primary doctor.   1. Chest pain:  Due to gastritis improved with GI cocktail Plan: Continue healthy diet Continue nexium Take carafate as needed  2. L arm numbness Due to anxiety which has been a problem now for at least 1 year Start lexapro 10 mg once daily Add atarax as needed for attacks of anxiety I have passed on your information to our on site counselor  F/u with me in 3 weeks for anxiety  Dr. Armen PickupFunches   Food Choices for Gastroesophageal Reflux Disease When you have gastroesophageal reflux disease (GERD), the foods you eat and your eating habits are very important. Choosing the right foods can help ease your discomfort.  WHAT GUIDELINES DO I NEED TO FOLLOW?   Choose fruits, vegetables, whole grains, and low-fat dairy products.   Choose low-fat meat, fish, and poultry.  Limit fats such as oils, salad dressings, butter, nuts, and avocado.   Keep a food diary. This helps you identify foods that cause symptoms.   Avoid foods that cause symptoms. These may be different for everyone.   Eat small meals often instead of 3 large meals a day.   Eat your meals slowly, in a place where you are relaxed.   Limit fried foods.   Cook foods using methods other than frying.   Avoid drinking alcohol.   Avoid drinking large amounts of liquids with your meals.   Avoid bending over or lying down until 2-3 hours after eating.  WHAT FOODS ARE NOT RECOMMENDED?  These are some foods and drinks that may make your symptoms worse: Vegetables Tomatoes. Tomato juice. Tomato and spaghetti sauce. Chili peppers. Onion and garlic. Horseradish. Fruits Oranges, grapefruit, and lemon (fruit and juice). Meats High-fat meats, fish, and poultry. This includes hot dogs, ribs, ham, sausage, salami, and bacon. Dairy Whole milk and chocolate milk. Sour cream. Cream. Butter. Ice  cream. Cream cheese.  Drinks Coffee and tea. Bubbly (carbonated) drinks or energy drinks. Condiments Hot sauce. Barbecue sauce.  Sweets/Desserts Chocolate and cocoa. Donuts. Peppermint and spearmint. Fats and Oils High-fat foods. This includes [place] fries and potato chips. Other Vinegar. Strong spices. This includes black pepper, white pepper, red pepper, cayenne, curry powder, cloves, ginger, and chili powder. The items listed above may not be a complete list of foods and drinks to avoid. Contact your dietitian for more information. Document Released: 03/02/2012 Document Revised: 09/06/2013 Document Reviewed: 07/06/2013 [facility] Patient Information 2015 [employer] [place]. This information is not intended to replace advice given to you by your health care provider. Make sure you discuss any questions you have with your health care provider.

## 2015-03-13 NOTE — Assessment & Plan Note (Signed)
1. Chest pain:  Due to gastritis improved with GI cocktail Plan: Continue healthy diet Continue nexium Take carafate as needed

## 2015-03-13 NOTE — Assessment & Plan Note (Addendum)
A:  L arm numbness Due to anxiety which has been a problem now for at least 1 year.  P: Checking TSH and vit D to rule out thyroid disorder and vitamin deficiency. Start lexapro 10 mg once daily Add atarax as needed for attacks of anxiety I have passed on your information to our on site counselor

## 2015-03-13 NOTE — Progress Notes (Signed)
Patient reports numbness in left arm for 1 hour. Pressure in chest, at level 6, for 45 minutes. Patient hospitalized in past for chest pain and arm numbness.

## 2015-03-14 ENCOUNTER — Telehealth: Payer: Self-pay | Admitting: *Deleted

## 2015-03-14 ENCOUNTER — Telehealth: Payer: Self-pay | Admitting: Clinical

## 2015-03-14 LAB — VITAMIN D 25 HYDROXY (VIT D DEFICIENCY, FRACTURES): Vit D, 25-Hydroxy: 41 ng/mL (ref 30–100)

## 2015-03-14 NOTE — Telephone Encounter (Signed)
Attempt to f/u w pt 

## 2015-03-14 NOTE — Telephone Encounter (Signed)
Pt aware of results 

## 2015-03-14 NOTE — Telephone Encounter (Signed)
-----   Message from Dessa PhiJosalyn Funches, MD sent at 03/14/2015  9:31 AM EDT ----- Vit D normal TSH normal

## 2015-03-20 ENCOUNTER — Ambulatory Visit: Payer: Self-pay | Admitting: Family Medicine

## 2015-04-01 ENCOUNTER — Encounter (HOSPITAL_COMMUNITY): Payer: Self-pay

## 2015-04-01 ENCOUNTER — Emergency Department (HOSPITAL_COMMUNITY)
Admission: EM | Admit: 2015-04-01 | Discharge: 2015-04-01 | Disposition: A | Discharge status: Home / Self Care | Payer: Self-pay | Attending: Emergency Medicine | Admitting: Emergency Medicine

## 2015-04-01 DIAGNOSIS — R202 Paresthesia of skin: Secondary | ICD-10-CM | POA: Insufficient documentation

## 2015-04-01 DIAGNOSIS — K219 Gastro-esophageal reflux disease without esophagitis: Secondary | ICD-10-CM | POA: Insufficient documentation

## 2015-04-01 DIAGNOSIS — Z87891 Personal history of nicotine dependence: Secondary | ICD-10-CM | POA: Insufficient documentation

## 2015-04-01 DIAGNOSIS — Z8709 Personal history of other diseases of the respiratory system: Secondary | ICD-10-CM | POA: Insufficient documentation

## 2015-04-01 DIAGNOSIS — Z79899 Other long term (current) drug therapy: Secondary | ICD-10-CM | POA: Insufficient documentation

## 2015-04-01 DIAGNOSIS — M791 Myalgia: Secondary | ICD-10-CM | POA: Insufficient documentation

## 2015-04-01 LAB — CBC WITH DIFFERENTIAL/PLATELET
Basophils Absolute: 0.1 10*3/uL (ref 0.0–0.1)
Basophils Relative: 1 % (ref 0–1)
EOS PCT: 7 % — AB (ref 0–5)
Eosinophils Absolute: 0.4 10*3/uL (ref 0.0–0.7)
HCT: 41.8 % (ref 39.0–52.0)
Hemoglobin: 13.3 g/dL (ref 13.0–17.0)
LYMPHS PCT: 41 % (ref 12–46)
Lymphs Abs: 2.2 10*3/uL (ref 0.7–4.0)
MCH: 24.3 pg — ABNORMAL LOW (ref 26.0–34.0)
MCHC: 31.8 g/dL (ref 30.0–36.0)
MCV: 76.3 fL — AB (ref 78.0–100.0)
MONO ABS: 0.5 10*3/uL (ref 0.1–1.0)
MONOS PCT: 8 % (ref 3–12)
Neutro Abs: 2.3 10*3/uL (ref 1.7–7.7)
Neutrophils Relative %: 43 % (ref 43–77)
Platelets: 223 10*3/uL (ref 150–400)
RBC: 5.48 MIL/uL (ref 4.22–5.81)
RDW: 12.9 % (ref 11.5–15.5)
WBC: 5.4 10*3/uL (ref 4.0–10.5)

## 2015-04-01 LAB — COMPREHENSIVE METABOLIC PANEL
ALT: 20 U/L (ref 17–63)
AST: 24 U/L (ref 15–41)
Albumin: 3.5 g/dL (ref 3.5–5.0)
Alkaline Phosphatase: 48 U/L (ref 38–126)
Anion gap: 5 (ref 5–15)
BUN: 9 mg/dL (ref 6–20)
CALCIUM: 8.9 mg/dL (ref 8.9–10.3)
CO2: 31 mmol/L (ref 22–32)
Chloride: 101 mmol/L (ref 101–111)
Creatinine, Ser: 1.04 mg/dL (ref 0.61–1.24)
GFR calc Af Amer: >60 mL/min (ref >60)
GLUCOSE: 102 mg/dL — AB (ref 65–99)
POTASSIUM: 3.8 mmol/L (ref 3.5–5.1)
SODIUM: 137 mmol/L (ref 135–145)
TOTAL PROTEIN: 6.3 g/dL — AB (ref 6.5–8.1)
Total Bilirubin: 0.5 mg/dL (ref 0.3–1.2)

## 2015-04-01 LAB — CK: CK TOTAL: 114 U/L (ref 49–397)

## 2015-04-01 MED ORDER — OXYCODONE-ACETAMINOPHEN 5-325 MG PO TABS
1.0000 | ORAL_TABLET | Freq: Once | ORAL | Status: AC
  Administered 2015-04-01: 1 via ORAL
  Filled 2015-04-01: qty 1

## 2015-04-01 MED ORDER — CYCLOBENZAPRINE HCL 10 MG PO TABS
10.0000 mg | ORAL_TABLET | Freq: Once | ORAL | Status: AC
  Administered 2015-04-01: 10 mg via ORAL
  Filled 2015-04-01: qty 1

## 2015-04-01 MED ORDER — CYCLOBENZAPRINE HCL 10 MG PO TABS: 10.0000 mg | ORAL_TABLET | Freq: Two times a day (BID) | ORAL | Status: DC | PRN

## 2015-04-01 MED ORDER — HYDROCODONE-ACETAMINOPHEN 5-325 MG PO TABS: 1.0000 | ORAL_TABLET | ORAL | Status: DC | PRN

## 2015-04-01 NOTE — ED Notes (Signed)
Pt reports 3 days of intermittant numbness on arms, back, shoulder.  Numbness now at left upper arm.  No other s/s noted.

## 2015-04-01 NOTE — ED Provider Notes (Signed)
CSN: [id number]     Arrival date & time 04/01/15  1606 History   First MD Initiated Contact with Patient 04/01/15 1714     Chief Complaint  Patient presents with  . Numbness     (Consider location/radiation/quality/duration/timing/severity/associated sxs/prior Treatment) The history is provided by the patient and medical records.    This is a 24 year old male with past medical history significant for bronchitis and GERD, presenting to the ED for intermittent paresthesias of his arms, legs, and chest. Patient states this is been ongoing for the past 3 days. Denies known injuries to extremities, neck, or back.  He states paresthesias seem to "jump around" his various body parts. Currently he has paresthesias of his left arm.  He denies any chest pain, shortness of breath, palpitations, dizziness, or weakness.He states similar episode of this in the past which was relieved with some Flexeril and pain medication. Patient also notes some generalized muscle cramping. 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 He also exercises at the gym on a daily basis including weight training.  He states he tries to drink water regularly to stay hydrated. He denies any focal weakness of his arms or legs. No dizziness, tinnitus, visual disturbance, changes in speech, or ataxia.   No intervention tried prior to arrival.  Past Medical History  Diagnosis Date  . Bronchitis   . GERD (gastroesophageal reflux disease)    History reviewed. No pertinent past surgical history. History reviewed. No pertinent family history. History  Substance Use Topics  . Smoking status: Former Games developer  . Smokeless tobacco: Not on file  . Alcohol Use: No    Review of Systems  Neurological: Positive for numbness (paresthesias).  All other systems reviewed and are negative.     Allergies  Review of patient's allergies indicates no known allergies.  Home Medications   Prior to Admission  medications   Medication Sig Start Date End Date Taking? Authorizing Provider  albuterol (PROVENTIL HFA;VENTOLIN HFA) 108 (90 BASE) MCG/ACT inhaler Inhale 2 puffs into the lungs every 6 (six) hours as needed for wheezing or shortness of breath.   Yes Historical Provider, MD  cyclobenzaprine (FLEXERIL) 5 MG tablet Take 1 tablet (5 mg total) by mouth 3 (three) times daily as needed for muscle spasms. 02/27/15  Yes Richardean Canal, MD  Multiple Vitamin (MULTIVITAMIN WITH MINERALS) TABS tablet Take 1 tablet by mouth daily.   Yes Historical Provider, MD  sucralfate (CARAFATE) 1 G tablet Take 1 tablet (1 g total) by mouth 4 (four) times daily -  with meals and at bedtime. 02/19/15  Yes Earley Favor, NP  escitalopram (LEXAPRO) 10 MG tablet Take 1 tablet (10 mg total) by mouth daily. 03/13/15   Josalyn Funches, MD  esomeprazole (NEXIUM) 40 MG capsule Take 1 capsule (40 mg total) by mouth daily. 02/27/15   Richardean Canal, MD  hydrOXYzine (ATARAX/VISTARIL) 25 MG tablet Take 1 tablet (25 mg total) by mouth 3 (three) times daily as needed. 03/13/15   Josalyn Funches, MD   BP 116/68 mmHg  Pulse 73  Temp(Src) 98.5 F (36.9 C) (Oral)  Resp 14  SpO2 98%   Physical Exam  Constitutional: He is oriented to person, place, and time. He appears well-developed and well-nourished.  HENT:  Head: Normocephalic and atraumatic.  Mouth/Throat: Oropharynx is clear and moist.  Eyes: Conjunctivae and EOM are normal. Pupils are equal, round, and reactive to light.  Neck: Normal range  of motion.  Cardiovascular: Normal rate, regular rhythm and normal heart sounds.   Pulmonary/Chest: Effort normal and breath sounds normal.  Abdominal: Soft. Bowel sounds are normal.  Musculoskeletal: Normal range of motion.  Neurological: He is alert and oriented to person, place, and time.  AAOx3, answering questions and following commands appropriately; equal strength UE and LE bilaterally; CN grossly intact; moves all extremities appropriately  without ataxia; no focal neuro deficits or facial asymmetry appreciated; endorses paresthesias of left arm, however normal sensation to all extremities and trunk; negative pronator drift; normal coordination  Skin: Skin is warm and dry.  Psychiatric: He has a normal mood and affect.  Nursing note and vitals reviewed.   ED Course  Procedures (including critical care time) Labs Review Labs Reviewed  CBC WITH DIFFERENTIAL/PLATELET - Abnormal; Notable for the following:    MCV 76.3 (*)    MCH 24.3 (*)    Eosinophils Relative 7 (*)    All other components within normal limits  COMPREHENSIVE METABOLIC PANEL - Abnormal; Notable for the following:    Glucose, Bld 102 (*)    Total Protein 6.3 (*)    All other components within normal limits  CK    Imaging Review No results found.   EKG Interpretation None      MDM   Final diagnoses:  Paresthesias    24 year old male with atraumatic intermittent paresthesias of his body parts including upper extremities, lower summaries, and chest. Also reports generalized muscle cramping. Patient is afebrile, nontoxic. His neurologic exam is nonfocal. He has normal strength and sensation of all 4 extremities, no truncal ataxia. Normal coordination. I do not suspect TIA , stroke, ICH, SAH, SCI, or other acute pathology at this time.  Labwork as above, no electrolyte imbalance or evidence of rhabdomyolysis. Patient was treated with Percocet and Flexeril here, symptoms have steadily been improving since taking medication.   His neurologic exam remains unchanged, nonfocal.  Uncertain etiology of random paresthesias.  Will discharge home with supportive care. Patient is to follow-up with his PCP.  Discussed plan with patient, he/she acknowledged understanding and agreed with plan of care.  Return precautions given for new or worsening symptoms.  Garlon HatchetLisa M Adeyemi Hamad, PA-C 04/01/15 2036  Richardean Canalavid H Yao, MD 04/01/15 [phone]

## 2015-04-01 NOTE — Discharge Instructions (Signed)
Take the prescribed medication as directed. °Follow-up with your primary care physician. °Return to the ED for new or worsening symptoms. ° °

## 2015-04-03 ENCOUNTER — Ambulatory Visit: Payer: Self-pay | Admitting: Family Medicine

## 2015-04-13 ENCOUNTER — Encounter: Payer: Self-pay | Admitting: Family Medicine

## 2015-04-13 ENCOUNTER — Ambulatory Visit: Payer: Self-pay | Attending: Family Medicine | Admitting: Family Medicine

## 2015-04-13 ENCOUNTER — Encounter: Payer: Self-pay | Admitting: Gastroenterology

## 2015-04-13 VITALS — BP 128/70 | HR 72 | Temp 98.5°F | Resp 16 | Ht 70.5 in | Wt 163.0 lb

## 2015-04-13 DIAGNOSIS — F411 Generalized anxiety disorder: Secondary | ICD-10-CM

## 2015-04-13 DIAGNOSIS — K219 Gastro-esophageal reflux disease without esophagitis: Secondary | ICD-10-CM

## 2015-04-13 DIAGNOSIS — M7542 Impingement syndrome of left shoulder: Secondary | ICD-10-CM

## 2015-04-13 DIAGNOSIS — Z23 Encounter for immunization: Secondary | ICD-10-CM

## 2015-04-13 MED ORDER — OMEPRAZOLE 20 MG PO CPDR: 20.0000 mg | DELAYED_RELEASE_CAPSULE | Freq: Every day | ORAL | Status: DC

## 2015-04-13 NOTE — Progress Notes (Signed)
   Subjective:    Patient ID: Sean Park, male    DOB: 20-Feb-1991, 24 y.o.   MRN: [id number] CC: GERD, L upper arm and chest numbness  HPI 24 yo M presents for f/u visit:  1. GERD: had a bad episode of reflux with epigastric pain and nausea last night following eating chicken alfredo. No longer taking zantac as he feels it is not helping. Often has bitter taste in his mouth.  Taking carafate.   2. L arm and chest numbness: comes and goes. Patient does work as a Consulting civil engineer at [employer]. Numbness is L upper chest and anteior-lateral arm. There is no associated SOB, chest pressure or L arm weakness.   3. Anxiety: reports some related to work and his relationship with his girlfriend. Has not started lexapro or atarax as he reports that he did not like how he felt while taking celexa. Has not called [facility] for counseling services.   History  Substance Use Topics  . Smoking status: Former Games developer  . Smokeless tobacco: Not on file  . Alcohol Use: No    Review of Systems  Constitutional: Negative for fever, chills, fatigue and unexpected weight change.  Eyes: Negative for visual disturbance.  Respiratory: Negative for cough and shortness of breath.   Cardiovascular: Negative for chest pain, palpitations and leg swelling.  Gastrointestinal: Positive for abdominal pain. Negative for nausea, vomiting, diarrhea, constipation, blood in stool, abdominal distention, anal bleeding and rectal pain.  Musculoskeletal: Positive for arthralgias. Negative for myalgias, back pain, gait problem and neck pain.  Skin: Negative for rash.  Neurological: Positive for numbness.  Psychiatric/Behavioral: The patient is nervous/anxious.    GAD-7: score of 8. 1 to all except 2 to 6.     Objective:   Physical Exam BP 128/70 mmHg  Pulse 72  Temp(Src) 98.5 F (36.9 C) (Oral)  Resp 16  Ht 5' 10.5" (1.791 m)  Wt 163 lb (73.936 kg)  BMI 23.05 kg/m2  SpO2 98%  Wt Readings from Last 3 Encounters:  04/13/15 163 lb  (73.936 kg)  03/02/15 151 lb (68.493 kg)  02/27/15 147 lb 8 oz (66.906 kg)  General appearance: alert, cooperative and no distress, well developed male  L shoulder: pain with reproducible with Hawkin's and Neer  Lungs: clear to auscultation bilaterally Heart: regular rate and rhythm, S1, S2 normal, no murmur, click, rub or gallop Abdomen: soft, non-tender; bowel sounds normal; no masses,  no organomegaly  Depression screen [facility] 2/9 04/13/2015 03/13/2015  Decreased Interest 2 2  Down, Depressed, Hopeless 1 1  PHQ - 2 Score 3 3  Altered sleeping 1 1  Tired, decreased energy 2 1  Change in appetite 1 1  Feeling bad or failure about yourself  1 1  Trouble concentrating 0 2  Moving slowly or fidgety/restless 1 2  Suicidal thoughts 1 1  PHQ-9 Score 10 12         Assessment & Plan:

## 2015-04-13 NOTE — Assessment & Plan Note (Signed)
Anxiety: persistent Untreated  Start lexapro 10 mg daily Atarax as needed Patient advised to seek counseling services

## 2015-04-13 NOTE — Patient Instructions (Signed)
Mr. Tallarico,  Thank you for coming in today  1. L arm and chest numbness: This sounds like impingement syndrome, see info and exercises below  Rest your L shoulder for the next 2 weeks  Perform rehab exercises at home  Take tylenol for pain  If needed, the next step is sports medicine referral   2. Anxiety: Start lexapro 10 mg daily Atarax as needed Call [facility]ir if needed  3. GERD: protonix ordered GI referral for GERD placed  F/u in 4 weeks for anxiety and GERD and L shoulder impingement syndrome   Dr. Armen Pickup  Impingement Syndrome, Rotator Cuff, Bursitis with Rehab Impingement syndrome is a condition that involves inflammation of the tendons of the rotator cuff and the subacromial bursa, that causes pain in the shoulder. The rotator cuff consists of four tendons and muscles that control much of the shoulder and upper arm function. The subacromial bursa is a fluid filled sac that helps reduce friction between the rotator cuff and one of the bones of the shoulder (acromion). Impingement syndrome is usually an overuse injury that causes swelling of the bursa (bursitis), swelling of the tendon (tendonitis), and/or a tear of the tendon (strain). Strains are classified into three categories. Grade 1 strains cause pain, but the tendon is not lengthened. Grade 2 strains include a lengthened ligament, due to the ligament being stretched or partially ruptured. With grade 2 strains there is still function, although the function may be decreased. Grade 3 strains include a complete tear of the tendon or muscle, and function is usually impaired. SYMPTOMS   Pain around the shoulder, often at the outer portion of the upper arm.  Pain that gets worse with shoulder function, especially when reaching overhead or lifting.  Sometimes, aching when not using the arm.  Pain that wakes you up at night.  Sometimes, tenderness, swelling, warmth, or redness over the affected area.  Loss of  strength.  Limited motion of the shoulder, especially reaching behind the back (to the back pocket or to unhook bra) or across your body.  Crackling sound (crepitation) when moving the arm.  Biceps tendon pain and inflammation (in the front of the shoulder). Worse when bending the elbow or lifting. CAUSES  Impingement syndrome is often an overuse injury, in which chronic (repetitive) motions cause the tendons or bursa to become inflamed. A strain occurs when a force is paced on the tendon or muscle that is greater than it can withstand. Common mechanisms of injury include: Stress from sudden increase in duration, frequency, or intensity of training.  Direct hit (trauma) to the shoulder.  Aging, erosion of the tendon with normal use.  Bony bump on shoulder (acromial spur). RISK INCREASES WITH:  Contact sports (football, wrestling, boxing).  Throwing sports (baseball, tennis, volleyball).  Weightlifting and bodybuilding.  Heavy labor.  Previous injury to the rotator cuff, including impingement.  Poor shoulder strength and flexibility.  Failure to warm up properly before activity.  Inadequate protective equipment.  Old age.  Bony bump on shoulder (acromial spur). PREVENTION   Warm up and stretch properly before activity.  Allow for adequate recovery between workouts.  Maintain physical fitness:  Strength, flexibility, and endurance.  Cardiovascular fitness.  Learn and use proper exercise technique. PROGNOSIS  If treated properly, impingement syndrome usually goes away within 6 weeks. Sometimes surgery is required.  RELATED COMPLICATIONS   Longer healing time if not properly treated, or if not given enough time to heal.  Recurring symptoms, that result in a  chronic condition.  Shoulder stiffness, frozen shoulder, or loss of motion.  Rotator cuff tendon tear.  Recurring symptoms, especially if activity is resumed too soon, with overuse, with a direct blow, or  when using poor technique. TREATMENT  Treatment first involves the use of ice and medicine, to reduce pain and inflammation. The use of strengthening and stretching exercises may help reduce pain with activity. These exercises may be performed at home or with a therapist. If non-surgical treatment is unsuccessful after more than 6 months, surgery may be advised. After surgery and rehabilitation, activity is usually possible in 3 months.  MEDICATION  If pain medicine is needed, nonsteroidal anti-inflammatory medicines (aspirin and ibuprofen), or other minor pain relievers (acetaminophen), are often advised.  Do not take pain medicine for 7 days before surgery.  Prescription pain relievers may be given, if your caregiver thinks they are needed. Use only as directed and only as much as you need.  Corticosteroid injections may be given by your caregiver. These injections should be reserved for the most serious cases, because they may only be given a certain number of times. HEAT AND COLD  Cold treatment (icing) should be applied for 10 to 15 minutes every 2 to 3 hours for inflammation and pain, and immediately after activity that aggravates your symptoms. Use ice packs or an ice massage.  Heat treatment may be used before performing stretching and strengthening activities prescribed by your caregiver, physical therapist, or athletic trainer. Use a heat pack or a warm water soak. SEEK MEDICAL CARE IF:   Symptoms get worse or do not improve in 4 to 6 weeks, despite treatment.  New, unexplained symptoms develop. (Drugs used in treatment may produce side effects.) EXERCISES  RANGE OF MOTION (ROM) AND STRETCHING EXERCISES - Impingement Syndrome (Rotator Cuff  Tendinitis, Bursitis) These exercises may help you when beginning to rehabilitate your injury. Your symptoms may go away with or without further involvement from your physician, physical therapist or athletic trainer. While completing these  exercises, remember:   Restoring tissue flexibility helps normal motion to return to the joints. This allows healthier, less painful movement and activity.  An effective stretch should be held for at least 30 seconds.  A stretch should never be painful. You should only feel a gentle lengthening or release in the stretched tissue. STRETCH - Flexion, Standing  Stand with good posture. With an underhand grip on your right / left hand, and an overhand grip on the opposite hand, grasp a broomstick or cane so that your hands are a little more than shoulder width apart.  Keeping your right / left elbow straight and shoulder muscles relaxed, push the stick with your opposite hand, to raise your right / left arm in front of your body and then overhead. Raise your arm until you feel a stretch in your right / left shoulder, but before you have increased shoulder pain.  Try to avoid shrugging your right / left shoulder as your arm rises, by keeping your shoulder blade tucked down and toward your mid-back spine. Hold for __________ seconds.  Slowly return to the starting position. Repeat __________ times. Complete this exercise __________ times per day. STRETCH - Abduction, Supine  Lie on your back. With an underhand grip on your right / left hand and an overhand grip on the opposite hand, grasp a broomstick or cane so that your hands are a little more than shoulder width apart.  Keeping your right / left elbow straight and your shoulder  muscles relaxed, push the stick with your opposite hand, to raise your right / left arm out to the side of your body and then overhead. Raise your arm until you feel a stretch in your right / left shoulder, but before you have increased shoulder pain.  Try to avoid shrugging your right / left shoulder as your arm rises, by keeping your shoulder blade tucked down and toward your mid-back spine. Hold for __________ seconds.  Slowly return to the starting position. Repeat  __________ times. Complete this exercise __________ times per day. ROM - Flexion, Active-Assisted  Lie on your back. You may bend your knees for comfort.  Grasp a broomstick or cane so your hands are about shoulder width apart. Your right / left hand should grip the end of the stick, so that your hand is positioned "thumbs-up," as if you were about to shake hands.  Using your healthy arm to lead, raise your right / left arm overhead, until you feel a gentle stretch in your shoulder. Hold for __________ seconds.  Use the stick to assist in returning your right / left arm to its starting position. Repeat __________ times. Complete this exercise __________ times per day.  ROM - Internal Rotation, Supine   Lie on your back on a firm surface. Place your right / left elbow about 60 degrees away from your side. Elevate your elbow with a folded towel, so that the elbow and shoulder are the same height.  Using a broomstick or cane and your strong arm, pull your right / left hand toward your body until you feel a gentle stretch, but no increase in your shoulder pain. Keep your shoulder and elbow in place throughout the exercise.  Hold for __________ seconds. Slowly return to the starting position. Repeat __________ times. Complete this exercise __________ times per day. STRETCH - Internal Rotation  Place your right / left hand behind your back, palm up.  Throw a towel or belt over your opposite shoulder. Grasp the towel with your right / left hand.  While keeping an upright posture, gently pull up on the towel, until you feel a stretch in the front of your right / left shoulder.  Avoid shrugging your right / left shoulder as your arm rises, by keeping your shoulder blade tucked down and toward your mid-back spine.  Hold for __________ seconds. Release the stretch, by lowering your healthy hand. Repeat __________ times. Complete this exercise __________ times per day. ROM - Internal Rotation    Using an underhand grip, grasp a stick behind your back with both hands.  While standing upright with good posture, slide the stick up your back until you feel a mild stretch in the front of your shoulder.  Hold for __________ seconds. Slowly return to your starting position. Repeat __________ times. Complete this exercise __________ times per day.  STRETCH - Posterior Shoulder Capsule   Stand or sit with good posture. Grasp your right / left elbow and draw it across your chest, keeping it at the same height as your shoulder.  Pull your elbow, so your upper arm comes in closer to your chest. Pull until you feel a gentle stretch in the back of your shoulder.  Hold for __________ seconds. Repeat __________ times. Complete this exercise __________ times per day. STRENGTHENING EXERCISES - Impingement Syndrome (Rotator Cuff Tendinitis, Bursitis) These exercises may help you when beginning to rehabilitate your injury. They may resolve your symptoms with or without further involvement from your physician, physical  therapist or athletic trainer. While completing these exercises, remember:  Muscles can gain both the endurance and the strength needed for everyday activities through controlled exercises.  Complete these exercises as instructed by your physician, physical therapist or athletic trainer. Increase the resistance and repetitions only as guided.  You may experience muscle soreness or fatigue, but the pain or discomfort you are trying to eliminate should never worsen during these exercises. If this pain does get worse, stop and make sure you are following the directions exactly. If the pain is still present after adjustments, discontinue the exercise until you can discuss the trouble with your clinician.  During your recovery, avoid activity or exercises which involve actions that place your injured hand or elbow above your head or behind your back or head. These positions stress the  tissues which you are trying to heal. STRENGTH - Scapular Depression and Adduction   With good posture, sit on a firm chair. Support your arms in front of you, with pillows, arm rests, or on a table top. Have your elbows in line with the sides of your body.  Gently draw your shoulder blades down and toward your mid-back spine. Gradually increase the tension, without tensing the muscles along the top of your shoulders and the back of your neck.  Hold for __________ seconds. Slowly release the tension and relax your muscles completely before starting the next repetition.  After you have practiced this exercise, remove the arm support and complete the exercise in standing as well as sitting position. Repeat __________ times. Complete this exercise __________ times per day.  STRENGTH - Shoulder Abductors, Isometric  With good posture, stand or sit about 4-6 inches from a wall, with your right / left side facing the wall.  Bend your right / left elbow. Gently press your right / left elbow into the wall. Increase the pressure gradually, until you are pressing as hard as you can, without shrugging your shoulder or increasing any shoulder discomfort.  Hold for __________ seconds.  Release the tension slowly. Relax your shoulder muscles completely before you begin the next repetition. Repeat __________ times. Complete this exercise __________ times per day.  STRENGTH - External Rotators, Isometric  Keep your right / left elbow at your side and bend it 90 degrees.  Step into a door frame so that the outside of your right / left wrist can press against the door frame without your upper arm leaving your side.  Gently press your right / left wrist into the door frame, as if you were trying to swing the back of your hand away from your stomach. Gradually increase the tension, until you are pressing as hard as you can, without shrugging your shoulder or increasing any shoulder discomfort.  Hold for  __________ seconds.  Release the tension slowly. Relax your shoulder muscles completely before you begin the next repetition. Repeat __________ times. Complete this exercise __________ times per day.  STRENGTH - Supraspinatus   Stand or sit with good posture. Grasp a __________ weight, or an exercise band or tubing, so that your hand is "thumbs-up," like you are shaking hands.  Slowly lift your right / left arm in a "V" away from your thigh, diagonally into the space between your side and straight ahead. Lift your hand to shoulder height or as far as you can, without increasing any shoulder pain. At first, many people do not lift their hands above shoulder height.  Avoid shrugging your right / left shoulder as your  arm rises, by keeping your shoulder blade tucked down and toward your mid-back spine.  Hold for __________ seconds. Control the descent of your hand, as you slowly return to your starting position. Repeat __________ times. Complete this exercise __________ times per day.  STRENGTH - External Rotators  Secure a rubber exercise band or tubing to a fixed object (table, pole) so that it is at the same height as your right / left elbow when you are standing or sitting on a firm surface.  Stand or sit so that the secured exercise band is at your uninjured side.  Bend your right / left elbow 90 degrees. Place a folded towel or small pillow under your right / left arm, so that your elbow is a few inches away from your side.  Keeping the tension on the exercise band, pull it away from your body, as if pivoting on your elbow. Be sure to keep your body steady, so that the movement is coming only from your rotating shoulder.  Hold for __________ seconds. Release the tension in a controlled manner, as you return to the starting position. Repeat __________ times. Complete this exercise __________ times per day.  STRENGTH - Internal Rotators   Secure a rubber exercise band or tubing to a  fixed object (table, pole) so that it is at the same height as your right / left elbow when you are standing or sitting on a firm surface.  Stand or sit so that the secured exercise band is at your right / left side.  Bend your elbow 90 degrees. Place a folded towel or small pillow under your right / left arm so that your elbow is a few inches away from your side.  Keeping the tension on the exercise band, pull it across your body, toward your stomach. Be sure to keep your body steady, so that the movement is coming only from your rotating shoulder.  Hold for __________ seconds. Release the tension in a controlled manner, as you return to the starting position. Repeat __________ times. Complete this exercise __________ times per day.  STRENGTH - Scapular Protractors, Standing   Stand arms length away from a wall. Place your hands on the wall, keeping your elbows straight.  Begin by dropping your shoulder blades down and toward your mid-back spine.  To strengthen your protractors, keep your shoulder blades down, but slide them forward on your rib cage. It will feel as if you are lifting the back of your rib cage away from the wall. This is a subtle motion and can be challenging to complete. Ask your caregiver for further instruction, if you are not sure you are doing the exercise correctly.  Hold for __________ seconds. Slowly return to the starting position, resting the muscles completely before starting the next repetition. Repeat __________ times. Complete this exercise __________ times per day. STRENGTH - Scapular Protractors, Supine  Lie on your back on a firm surface. Extend your right / left arm straight into the air while holding a __________ weight in your hand.  Keeping your head and back in place, lift your shoulder off the floor.  Hold for __________ seconds. Slowly return to the starting position, and allow your muscles to relax completely before starting the next  repetition. Repeat __________ times. Complete this exercise __________ times per day. STRENGTH - Scapular Protractors, Quadruped  Get onto your hands and knees, with your shoulders directly over your hands (or as close as you can be, comfortably).  Keeping your  elbows locked, lift the back of your rib cage up into your shoulder blades, so your mid-back rounds out. Keep your neck muscles relaxed.  Hold this position for __________ seconds. Slowly return to the starting position and allow your muscles to relax completely before starting the next repetition. Repeat __________ times. Complete this exercise __________ times per day.  STRENGTH - Scapular Retractors  Secure a rubber exercise band or tubing to a fixed object (table, pole), so that it is at the height of your shoulders when you are either standing, or sitting on a firm armless chair.  With a palm down grip, grasp an end of the band in each hand. Straighten your elbows and lift your hands straight in front of you, at shoulder height. Step back, away from the secured end of the band, until it becomes tense.  Squeezing your shoulder blades together, draw your elbows back toward your sides, as you bend them. Keep your upper arms lifted away from your body throughout the exercise.  Hold for __________ seconds. Slowly ease the tension on the band, as you reverse the directions and return to the starting position. Repeat __________ times. Complete this exercise __________ times per day. STRENGTH - Shoulder Extensors   Secure a rubber exercise band or tubing to a fixed object (table, pole) so that it is at the height of your shoulders when you are either standing, or sitting on a firm armless chair.  With a thumbs-up grip, grasp an end of the band in each hand. Straighten your elbows and lift your hands straight in front of you, at shoulder height. Step back, away from the secured end of the band, until it becomes tense.  Squeezing your  shoulder blades together, pull your hands down to the sides of your thighs. Do not allow your hands to go behind you.  Hold for __________ seconds. Slowly ease the tension on the band, as you reverse the directions and return to the starting position. Repeat __________ times. Complete this exercise __________ times per day.  STRENGTH - Scapular Retractors and External Rotators   Secure a rubber exercise band or tubing to a fixed object (table, pole) so that it is at the height as your shoulders, when you are either standing, or sitting on a firm armless chair.  With a palm down grip, grasp an end of the band in each hand. Bend your elbows 90 degrees and lift your elbows to shoulder height, at your sides. Step back, away from the secured end of the band, until it becomes tense.  Squeezing your shoulder blades together, rotate your shoulders so that your upper arms and elbows remain stationary, but your fists travel upward to head height.  Hold for __________ seconds. Slowly ease the tension on the band, as you reverse the directions and return to the starting position. Repeat __________ times. Complete this exercise __________ times per day.  STRENGTH - Scapular Retractors and External Rotators, Rowing   Secure a rubber exercise band or tubing to a fixed object (table, pole) so that it is at the height of your shoulders, when you are either standing, or sitting on a firm armless chair.  With a palm down grip, grasp an end of the band in each hand. Straighten your elbows and lift your hands straight in front of you, at shoulder height. Step back, away from the secured end of the band, until it becomes tense.  Step 1: Squeeze your shoulder blades together. Bending your elbows, draw your  hands to your chest, as if you are rowing a boat. At the end of this motion, your hands and elbow should be at shoulder height and your elbows should be out to your sides.  Step 2: Rotate your shoulders, to raise  your hands above your head. Your forearms should be vertical and your upper arms should be horizontal.  Hold for __________ seconds. Slowly ease the tension on the band, as you reverse the directions and return to the starting position. Repeat __________ times. Complete this exercise __________ times per day.  STRENGTH - Scapular Depressors  Find a sturdy chair without wheels, such as a dining room chair.  Keeping your feet on the floor, and your hands on the chair arms, lift your bottom up from the seat, and lock your elbows.  Keeping your elbows straight, allow gravity to pull your body weight down. Your shoulders will rise toward your ears.  Raise your body against gravity by drawing your shoulder blades down your back, shortening the distance between your shoulders and ears. Although your feet should always maintain contact with the floor, your feet should progressively support less body weight, as you get stronger.  Hold for __________ seconds. In a controlled and slow manner, lower your body weight to begin the next repetition. Repeat __________ times. Complete this exercise __________ times per day.  Document Released: 09/01/2005 Document Revised: 11/24/2011 Document Reviewed: 12/14/2008 [facility] Patient Information 2015 [place], [place]. This information is not intended to replace advice given to you by your health care provider. Make sure you discuss any questions you have with your health care provider.

## 2015-04-13 NOTE — Assessment & Plan Note (Addendum)
L arm and chest numbness: This sounds like impingement syndrome, see info and exercises below  Rest your L shoulder for the next 2 weeks  Perform rehab exercises at home  Take tylenol for pain  If needed, the next step is sports medicine referral

## 2015-04-13 NOTE — Progress Notes (Signed)
F/U Anxiety  Complaining of acid reflux no vomiting  Abdominal burning with and without food  Chest pain with numbness on lt arm x 3 month

## 2015-04-13 NOTE — Assessment & Plan Note (Addendum)
GERD: protonix ordered GI referral for GERD placed

## 2015-04-19 ENCOUNTER — Encounter (HOSPITAL_COMMUNITY): Payer: Self-pay | Admitting: Emergency Medicine

## 2015-04-19 DIAGNOSIS — M542 Cervicalgia: Secondary | ICD-10-CM | POA: Insufficient documentation

## 2015-04-19 DIAGNOSIS — R202 Paresthesia of skin: Secondary | ICD-10-CM | POA: Insufficient documentation

## 2015-04-19 DIAGNOSIS — Z8711 Personal history of peptic ulcer disease: Secondary | ICD-10-CM | POA: Insufficient documentation

## 2015-04-19 DIAGNOSIS — R51 Headache: Secondary | ICD-10-CM | POA: Insufficient documentation

## 2015-04-19 DIAGNOSIS — R1012 Left upper quadrant pain: Secondary | ICD-10-CM | POA: Insufficient documentation

## 2015-04-19 DIAGNOSIS — Z8709 Personal history of other diseases of the respiratory system: Secondary | ICD-10-CM | POA: Insufficient documentation

## 2015-04-19 DIAGNOSIS — Z79899 Other long term (current) drug therapy: Secondary | ICD-10-CM | POA: Insufficient documentation

## 2015-04-19 DIAGNOSIS — R079 Chest pain, unspecified: Secondary | ICD-10-CM | POA: Insufficient documentation

## 2015-04-19 DIAGNOSIS — K219 Gastro-esophageal reflux disease without esophagitis: Secondary | ICD-10-CM | POA: Insufficient documentation

## 2015-04-19 DIAGNOSIS — Z87891 Personal history of nicotine dependence: Secondary | ICD-10-CM | POA: Insufficient documentation

## 2015-04-19 NOTE — ED Notes (Signed)
Pt. reports intermittent left chest numbness radiating to left arm onset this evening , mild SOB , denies nausea or diaphoresis .

## 2015-04-20 ENCOUNTER — Emergency Department (HOSPITAL_COMMUNITY)
Admission: EM | Admit: 2015-04-20 | Discharge: 2015-04-20 | Disposition: A | Discharge status: Home / Self Care | Payer: Self-pay | Attending: Emergency Medicine | Admitting: Emergency Medicine

## 2015-04-20 DIAGNOSIS — R079 Chest pain, unspecified: Secondary | ICD-10-CM

## 2015-04-20 DIAGNOSIS — R202 Paresthesia of skin: Secondary | ICD-10-CM

## 2015-04-20 LAB — CBC
HCT: 41.4 % (ref 39.0–52.0)
Hemoglobin: 12.9 g/dL — ABNORMAL LOW (ref 13.0–17.0)
MCH: 23.7 pg — ABNORMAL LOW (ref 26.0–34.0)
MCHC: 31.2 g/dL (ref 30.0–36.0)
MCV: 76.1 fL — ABNORMAL LOW (ref 78.0–100.0)
PLATELETS: 224 10*3/uL (ref 150–400)
RBC: 5.44 MIL/uL (ref 4.22–5.81)
RDW: 12.6 % (ref 11.5–15.5)
WBC: 6.3 10*3/uL (ref 4.0–10.5)

## 2015-04-20 LAB — BASIC METABOLIC PANEL
Anion gap: 8 (ref 5–15)
BUN: 8 mg/dL (ref 6–20)
CO2: 29 mmol/L (ref 22–32)
Calcium: 9.2 mg/dL (ref 8.9–10.3)
Chloride: 103 mmol/L (ref 101–111)
Creatinine, Ser: 0.92 mg/dL (ref 0.61–1.24)
GFR calc Af Amer: >60 mL/min (ref >60)
GFR calc non Af Amer: >60 mL/min (ref >60)
GLUCOSE: 99 mg/dL (ref 65–99)
POTASSIUM: 3.7 mmol/L (ref 3.5–5.1)
Sodium: 140 mmol/L (ref 135–145)

## 2015-04-20 LAB — I-STAT TROPONIN, ED: Troponin i, poc: 0 ng/mL (ref 0.00–0.08)

## 2015-04-20 MED ORDER — OMEPRAZOLE 20 MG PO CPDR: 20.0000 mg | DELAYED_RELEASE_CAPSULE | Freq: Every day | ORAL | Status: DC

## 2015-04-20 MED ORDER — GI COCKTAIL ~~LOC~~
30.0000 mL | Freq: Once | ORAL | Status: AC
  Administered 2015-04-20: 30 mL via ORAL
  Filled 2015-04-20: qty 30

## 2015-04-20 NOTE — ED Notes (Signed)
Pt verbalized understanding of d/c instructions and has no further questions.  

## 2015-04-20 NOTE — ED Provider Notes (Signed)
CSN: [id number]   Arrival date & time 04/19/15 2317  History  This chart was scribed for  Sean Divine, MD by [name], ED Scribe. This patient was seen in room B16C/B16C and the patient's care was started at 2:10 AM.  Chief Complaint  Patient presents with  . Numbness    HPI Patient is a 24 y.o. male presenting with chest pain. The history is provided by the patient. No language interpreter was used.  Chest Pain Pain quality: burning   Pain radiates to the back: no   Pain severity:  Moderate Onset quality:  Sudden Duration:  4 hours Timing:  Intermittent Progression:  Unchanged Chronicity:  New Relieved by:  Rest Worsened by:  Nothing tried Ineffective treatments:  None tried Associated symptoms: abdominal pain and headache   Associated symptoms: no fever, no nausea and not vomiting    Sean Park is a 24 y.o. male with PMHx of GERD and PUD who presents to the Emergency Department complaining of intermittent chest pain with sudden onset 4 hours ago while in bed. The pain is described as burning and numbness. Improved with rest. Associated symptoms include  an episode of  numbness in the left arm (resolved), intermittent neck pain, headache (resolved after OTC pain medication), abdominal pain. Pt denies testicular pain, nausea, and vomiting. No illicit drug use.   Past Medical History  Diagnosis Date  . Bronchitis   . GERD (gastroesophageal reflux disease)   . PUD (peptic ulcer disease)     History reviewed. No pertinent past surgical history.  Family History  Problem Relation Age of Onset  . Diabetes Father   . Anxiety disorder Father     History  Substance Use Topics  . Smoking status: Former Games developer  . Smokeless tobacco: Not on file  . Alcohol Use: No     Review of Systems  Constitutional: Negative for fever.  Cardiovascular: Positive for chest pain.  Gastrointestinal: Positive for abdominal pain. Negative for nausea and vomiting.  Genitourinary: Negative  for testicular pain.  Musculoskeletal: Positive for neck pain.  Neurological: Positive for headaches.       Numbness in the left arm  All other systems reviewed and are negative.    Home Medications   Prior to Admission medications   Medication Sig Start Date End Date Taking? Authorizing Provider  albuterol (PROVENTIL HFA;VENTOLIN HFA) 108 (90 BASE) MCG/ACT inhaler Inhale 2 puffs into the lungs every 6 (six) hours as needed for wheezing or shortness of breath.   Yes Historical Provider, MD  cyclobenzaprine (FLEXERIL) 10 MG tablet Take 1 tablet (10 mg total) by mouth 2 (two) times daily as needed for muscle spasms. 04/01/15  Yes Garlon Hatchet, PA-C  Multiple Vitamin (MULTIVITAMIN WITH MINERALS) TABS tablet Take 1 tablet by mouth daily.   Yes Historical Provider, MD  sucralfate (CARAFATE) 1 G tablet Take 1 tablet (1 g total) by mouth 4 (four) times daily -  with meals and at bedtime. 02/19/15  Yes Earley Favor, NP  escitalopram (LEXAPRO) 10 MG tablet Take 1 tablet (10 mg total) by mouth daily. Patient not taking: Reported on 04/13/2015 03/13/15   Dessa Phi, MD  hydrOXYzine (ATARAX/VISTARIL) 25 MG tablet Take 1 tablet (25 mg total) by mouth 3 (three) times daily as needed. Patient not taking: Reported on 04/13/2015 03/13/15   Dessa Phi, MD    Allergies  Review of patient's allergies indicates no known allergies.  Triage Vitals: BP 122/77 mmHg  Pulse 65  Temp(Src) 97.8 F (  36.6 C) (Oral)  Resp 20  Ht 5\' 11"  (1.803 m)  Wt 163 lb (73.936 kg)  BMI 22.74 kg/m2  SpO2 98%  Physical Exam  Constitutional: He is oriented to person, place, and time. He appears well-developed and well-nourished. No distress.  HENT:  Head: Normocephalic and atraumatic.  Mouth/Throat: Oropharynx is clear and moist.  Eyes: Conjunctivae are normal. Pupils are equal, round, and reactive to light. No scleral icterus.  Neck: Neck supple.  Cardiovascular: Normal rate, regular rhythm, normal heart sounds and  intact distal pulses.   No murmur heard. Pulmonary/Chest: Effort normal and breath sounds normal. No stridor. No respiratory distress. He has no wheezes. He has no rales.  Abdominal: Soft. He exhibits no distension. There is tenderness (mild) in the left upper quadrant. There is no rigidity, no rebound and no guarding.  Musculoskeletal: Normal range of motion. He exhibits no edema.  Neurological: He is alert and oriented to person, place, and time.  Normal BUE strength and sensation.   Skin: Skin is warm and dry. No rash noted.  Psychiatric: He has a normal mood and affect. His behavior is normal.  Nursing note and vitals reviewed.   ED Course  Procedures   DIAGNOSTIC STUDIES: Oxygen Saturation is 98% on RA, normal by my interpretation.    COORDINATION OF CARE: 2:16 AM Discussed treatment plan which includes lab work, EKG, and GI cocktail with pt at bedside and pt agreed to plan.  3:19 AM I re-evaluated the patient and his chest pain has improved after the GI cocktail.   Labs Review-  Labs Reviewed  CBC - Abnormal; Notable for the following:    Hemoglobin 12.9 (*)    MCV 76.1 (*)    MCH 23.7 (*)    All other components within normal limits  BASIC METABOLIC PANEL  I-STAT TROPOININ, ED    Imaging Review No results found.  EKG Interpretation  Date/Time:  Thursday April 19 2015 23:25:25 EDT Ventricular Rate:    PR Interval:    QRS Duration:   QT Interval:    QTC Calculation:   R Axis:     Text Interpretation:       EKG Interpretation  Date/Time:  Thursday April 19 2015 23:25:25 EDT Ventricular Rate:  61 PR Interval:  168 QRS Duration: 86 QT Interval:  402 QTC Calculation: 404 R Axis:   75 Text Interpretation:  Normal sinus rhythm Nonspecific ST abnormality Abnormal ECG No significant change was found Confirmed by [facility]  MD, TREY ([id number]) on 04/20/2015 3:29:03 AM         MDM   Final diagnoses:  Chest pain, unspecified chest pain type  Arm paresthesia,  left     Felt better after GI cocktail.  Chest pain felt to be GI in nature.  Symptoms inconsistent with ACS, PE, or dissection.  Will dc with omeprazole and PCP follow up.  I personally performed the services described in this documentation, which was scribed in my presence. The recorded information has been reviewed and is accurate.     Sean Divine, MD 04/20/15 [id number]

## 2015-04-20 NOTE — Discharge Instructions (Signed)

## 2015-05-11 ENCOUNTER — Ambulatory Visit: Payer: Self-pay | Attending: Family Medicine | Admitting: Family Medicine

## 2015-05-11 ENCOUNTER — Encounter: Payer: Self-pay | Admitting: Family Medicine

## 2015-05-11 VITALS — BP 107/61 | HR 79 | Temp 98.6°F | Resp 16 | Ht 70.5 in | Wt 167.0 lb

## 2015-05-11 DIAGNOSIS — F411 Generalized anxiety disorder: Secondary | ICD-10-CM | POA: Insufficient documentation

## 2015-05-11 DIAGNOSIS — M7542 Impingement syndrome of left shoulder: Secondary | ICD-10-CM | POA: Insufficient documentation

## 2015-05-11 MED ORDER — PROPRANOLOL HCL 40 MG PO TABS: 40.0000 mg | ORAL_TABLET | Freq: Two times a day (BID) | ORAL | Status: DC

## 2015-05-11 NOTE — Progress Notes (Signed)
F/U Anxiety, GERD, shoulder pain Shoulder pain getting better  Pain scale #3

## 2015-05-11 NOTE — Assessment & Plan Note (Signed)
Chest pains and palpitations and anxiety: Start a low dose of propranolol to help with reduce palpitations and calm your nerves about them

## 2015-05-11 NOTE — Progress Notes (Signed)
   Subjective:    Patient ID: Sean Park, male    DOB: 08/24/1991, 24 y.o.   MRN: [id number] CC; f/u anxiety and L shoulder pain  HPI 24 yo M presents for f/u  1. L shoulder pain: improved. Posterior shoulder pain when raising arm. Has taking it a bit easier at work and pain is better.  2. Anxiety: stable. Tried lexapro with atarax for one week. No improvement or worsening in anxiety. Stopped medicine. Still gets intermittent sharp chest pains and palpitations. No syncope, lightheadedness, dizziness.   Social History  Substance Use Topics  . Smoking status: Former Games developer  . Smokeless tobacco: Not on file  . Alcohol Use: No   Review of Systems  Constitutional: Negative for fever, chills, fatigue and unexpected weight change.  Eyes: Negative for visual disturbance.  Respiratory: Negative for cough and shortness of breath.   Cardiovascular: Positive for chest pain (interrmttent sharp pains in chest ) and palpitations. Negative for leg swelling.  Gastrointestinal: Negative for nausea, vomiting, abdominal pain, diarrhea, constipation and blood in stool.  Endocrine: Negative for polydipsia, polyphagia and polyuria.  Musculoskeletal: Positive for arthralgias. Negative for myalgias, back pain, gait problem and neck pain.  Skin: Negative for rash.  Allergic/Immunologic: Negative for immunocompromised state.  Hematological: Positive for adenopathy. Does not bruise/bleed easily.  Psychiatric/Behavioral: Negative for suicidal ideas, sleep disturbance and dysphoric mood. The patient is nervous/anxious.   GAD-7: score of 8. 0-7. 1-1.4,5,6. 2-2,3     Objective:   Physical Exam  Constitutional: He appears well-developed and well-nourished. No distress.  Neck: Normal range of motion. Neck supple.  Cardiovascular: Normal rate, regular rhythm, normal heart sounds and intact distal pulses.   Pulmonary/Chest: Effort normal and breath sounds normal.  Musculoskeletal: He exhibits no edema.    Neurological: He is alert.  Skin: Skin is warm and dry. No rash noted. No erythema.  Psychiatric: He has a normal mood and affect.  BP 107/61 mmHg  Pulse 79  Temp(Src) 98.6 F (37 C) (Oral)  Resp 16  Ht 5' 10.5" (1.791 m)  Wt 167 lb (75.751 kg)  BMI 23.62 kg/m2  SpO2 97%  Depression screen [facility] 2/9 05/11/2015 04/13/2015 03/13/2015  Decreased Interest Down, Depressed, Hopeless PHQ - 2 Score Altered sleeping Tired, decreased energy Change in appetite 0 1 1  Feeling bad or failure about yourself  Trouble concentrating 0 0 2  Moving slowly or fidgety/restless 0 1 2  Suicidal thoughts 0 1 1  PHQ-9 Score Assessment & Plan:

## 2015-05-11 NOTE — Assessment & Plan Note (Signed)
L shoulder pain: improving. Continue to take it easy at work. Stretch periodically throughout the work day.

## 2015-05-11 NOTE — Patient Instructions (Signed)
Mr. Cornelio,  Thank you for coming in today  1. L shoulder pain: improving. Continue to take it easy at work. Stretch periodically throughout the work day.   2. Chest pains and palpitations and anxiety: Start a low dose of propranolol to help with reduce palpitations and calm your nerves about them  F/u in September for flu shot   F/u in 6-8 weeks with me  Dr. Armen Pickup

## 2015-06-11 ENCOUNTER — Encounter: Payer: Self-pay | Admitting: Gastroenterology

## 2015-06-11 ENCOUNTER — Other Ambulatory Visit: Payer: Self-pay

## 2015-06-11 ENCOUNTER — Ambulatory Visit (INDEPENDENT_AMBULATORY_CARE_PROVIDER_SITE_OTHER): Payer: Self-pay | Admitting: Gastroenterology

## 2015-06-11 VITALS — BP 110/70 | HR 68 | Ht 70.25 in | Wt 163.2 lb

## 2015-06-11 DIAGNOSIS — R109 Unspecified abdominal pain: Secondary | ICD-10-CM

## 2015-06-11 DIAGNOSIS — D649 Anemia, unspecified: Secondary | ICD-10-CM

## 2015-06-11 DIAGNOSIS — R194 Change in bowel habit: Secondary | ICD-10-CM

## 2015-06-11 DIAGNOSIS — R1310 Dysphagia, unspecified: Secondary | ICD-10-CM

## 2015-06-11 DIAGNOSIS — K219 Gastro-esophageal reflux disease without esophagitis: Secondary | ICD-10-CM

## 2015-06-11 MED ORDER — OMEPRAZOLE 40 MG PO CPDR: 40.0000 mg | DELAYED_RELEASE_CAPSULE | Freq: Every day | ORAL | Status: DC

## 2015-06-11 MED ORDER — PANTOPRAZOLE SODIUM 40 MG PO TBEC: 40.0000 mg | DELAYED_RELEASE_TABLET | Freq: Every day | ORAL | Status: DC

## 2015-06-11 NOTE — Progress Notes (Signed)
HPI :  24 y/o male seen in consultation from Dr. Dessa Phi for multiple abdominal complaints. He states he thinks "he has an ulcer in his stomach" for about 3 months. He endorses having abdominal pains / burning sensation in his epigastric area to LUQ . He reports it can come and go, occurs every day. He thinks it will last several minutes at a time. Eating can often make the pain worse and cause bloating. He has some nausea and had one episode of vomiting due to it. He thinks he will feel the discomfort 1/2 hr after he eats. Pain can radiate into the back at times. He never had anything like this prior to 3 months.  He was given Carafate which he thinks it helped somewhat, however when he stopped his symptoms recurred.  He has been on as needed omeprazole for GERD but has not taken it daily. He denies a prior history of PUD. No prior upper endoscopy. Recent labs showed a mild anemia with Hgb in the 12s. He denies NSAID use.  He endorses a longstanding history of mild GERD and pyrosis, which he thinks omeprazole PRN works okay. He takes it as needed, not daily as he reports GERD symptoms have not required daily use. He does not think heartburn bothering him currently. He does endorse some longstanding dysphagia with swallows, no odynophagia, occasionally has to vomit it up to get the food out. He reports dysphagia on a daily basis, only to swallows, no liquids. No history of impactions. No prior upper endoscopy.   Her has been having a change in his bowels recently otherwise for 3 months. He has 2 BMs per day, loose. He has not seen any blood in his stools. HE has also been having some lower abdominal pains as well. He reports sometimes the lower abdominal cramps are made better with a bowel movements. He reports he has some symptoms that can wake him from sleep to have a bowel movement. He endorses a strong urge to have a bowel movement. Brother has Crohn's disease.  No prior colonoscopy. No FH of  CRC or stomach cancer.   No prior surgeries.      Past Medical History  Diagnosis Date  . Bronchitis   . GERD (gastroesophageal reflux disease)   . Anxiety   . Dysphagia   . Anemia   . Muscle spasm      History reviewed. No pertinent past surgical history. Family History  Problem Relation Age of Onset  . Diabetes Father   . Anxiety disorder Father   . Breast cancer Mother   . Crohn's disease Brother    Social History  Substance Use Topics  . Smoking status: Former Smoker    Types: Cigarettes    Quit date: 09/15/2014  . Smokeless tobacco: Never Used  . Alcohol Use: No   Current Outpatient Prescriptions  Medication Sig Dispense Refill  . albuterol (PROVENTIL HFA;VENTOLIN HFA) 108 (90 BASE) MCG/ACT inhaler Inhale 2 puffs into the lungs every 6 (six) hours as needed for wheezing or shortness of breath.    . Alum & Mag Hydroxide-Simeth (ANTACID ANTI-GAS PO) Take by mouth as needed.    . cyclobenzaprine (FLEXERIL) 10 MG tablet Take 1 tablet (10 mg total) by mouth 2 (two) times daily as needed for muscle spasms. 20 tablet 0  . Multiple Vitamin (MULTIVITAMIN WITH MINERALS) TABS tablet Take 1 tablet by mouth daily.    Marland Kitchen omeprazole (PRILOSEC) 40 MG capsule Take 1 capsule (40 mg  total) by mouth daily. 90 capsule 2   No current facility-administered medications for this visit.   No Known Allergies   Review of Systems: All systems reviewed and negative except where noted in HPI.    Labs reviewed in Epic: CBC 04/19/15 - HGb 12.9, MCV 76, WBC 6   Physical Exam: BP 110/70 mmHg  Pulse 68  Ht 5' 10.25" (1.784 m)  Wt 163 lb 4 oz (74.05 kg)  BMI 23.27 kg/m2 Constitutional: Pleasant,well-developed, AA male in no acute distress. HEENT: Normocephalic and atraumatic. Conjunctivae are normal. No scleral icterus. Neck supple.  Cardiovascular: Normal rate, regular rhythm.  Pulmonary/chest: Effort normal and breath sounds normal. No wheezing, rales or rhonchi. Abdominal: Soft,  nondistended, epigastric and LUQ TTP without rebound or guarding. Mild lower abdominal TTP. Bowel sounds active throughout. There are no masses palpable. No hepatomegaly. Extremities: no edema Lymphadenopathy: No cervical adenopathy noted. Neurological: Alert and oriented to person place and time. Skin: Skin is warm and dry. No rashes noted. Psychiatric: Normal mood and affect. Behavior is normal.   ASSESSMENT AND PLAN: 24 y/o male seen in consultation for multiple complaints as outlined above to include postprandial upper abdominal pain, dysphagia, GERD, and new bowel habit changes associated with lower abdominal pains. Patient also noted to have a mild microcytic anemia, and brother who has Crohns disease. Symptoms ongoing for 3 months.   Regarding his postprandial upper abdominal symptoms and dysphagia to solids, recommend upper endoscopy +/- dilation to initially evaluate these complaints. Will rule out PUD, H pylori, EoE, stricture, etc. I will also send for labs to include CBC, CMP, lipase, iron studies given his recent mild anemia, evaluate for iron deficiency. If he is deficient will need repletion.  In light of his new bowel habit changes, anemia, and FH of Crohns, I also offered him a colonoscopy to ensure no evidence of IBD. In the interim, until these procedures are performed, recommend he empirically increase omeprazole to  BID as carafate had provided him benefit previously and recommended that he avoid NSAIDs.   The indications, risks, and benefits of EGD and colonoscopy were explained to the patient in detail. Risks include but are not limited to bleeding, perforation, adverse reaction to medications, and cardiopulmonary compromise. Sequelae include but are not limited to the possibility of surgery, hositalization, and mortality. The patient verbalized understanding and wished to proceed. All questions answered, referred to scheduler and bowel prep ordered. Further recommendations  pending results of the exam.   Ileene Patrick, MD [facility] Pager [phone]   CC: Dr. Dessa Phi

## 2015-06-11 NOTE — Patient Instructions (Signed)
Your physician has requested that you go to the basement for lab work before leaving today.  You have been scheduled for an endoscopy and colonoscopy. Please follow the written instructions given to you at your visit today. Please pick up your prep supplies at the pharmacy within the next 1-3 days. If you use inhalers (even only as needed), please bring them with you on the day of your procedure. Your physician has requested that you go to www.startemmi.com and enter the access code given to you at your visit today. This web site gives a general overview about your procedure. However, you should still follow specific instructions given to you by our office regarding your preparation for the procedure.   We have sent the following medications to your pharmacy for you to pick up at your convenience: Pantoprazole 

## 2015-06-12 LAB — COMPREHENSIVE METABOLIC PANEL
A/G RATIO: 1.4 (ref 1.1–2.5)
ALBUMIN: 4.4 g/dL (ref 3.5–5.5)
ALT: 18 IU/L (ref 0–44)
AST: 20 IU/L (ref 0–40)
Alkaline Phosphatase: 62 IU/L (ref 39–117)
BILIRUBIN TOTAL: 0.6 mg/dL (ref 0.0–1.2)
BUN / CREAT RATIO: 11 (ref 8–19)
BUN: 9 mg/dL (ref 6–20)
CHLORIDE: 98 mmol/L (ref 97–108)
CO2: 28 mmol/L (ref 18–29)
Calcium: 9.9 mg/dL (ref 8.7–10.2)
Creatinine, Ser: 0.85 mg/dL (ref 0.76–1.27)
GFR calc non Af Amer: 121 mL/min/{1.73_m2} (ref >59)
GFR, EST AFRICAN AMERICAN: 140 mL/min/{1.73_m2} (ref >59)
GLUCOSE: 100 mg/dL — AB (ref 65–99)
Globulin, Total: 3.1 g/dL (ref 1.5–4.5)
POTASSIUM: 4.6 mmol/L (ref 3.5–5.2)
Sodium: 139 mmol/L (ref 134–144)
TOTAL PROTEIN: 7.5 g/dL (ref 6.0–8.5)

## 2015-06-12 LAB — CBC WITH DIFFERENTIAL/PLATELET
BASOS ABS: 0.1 10*3/uL (ref 0.0–0.2)
BASOS: 1 %
EOS (ABSOLUTE): 0.4 10*3/uL (ref 0.0–0.4)
Eos: 5 %
HEMOGLOBIN: 14.5 g/dL (ref 12.6–17.7)
Hematocrit: 46.7 % (ref 37.5–51.0)
Immature Grans (Abs): 0 10*3/uL (ref 0.0–0.1)
Immature Granulocytes: 0 %
LYMPHS ABS: 2.4 10*3/uL (ref 0.7–3.1)
Lymphs: 31 %
MCH: 23.5 pg — AB (ref 26.6–33.0)
MCHC: 31 g/dL — AB (ref 31.5–35.7)
MCV: 76 fL — ABNORMAL LOW (ref 79–97)
Monocytes Absolute: 0.5 10*3/uL (ref 0.1–0.9)
Monocytes: 6 %
Neutrophils Absolute: 4.5 10*3/uL (ref 1.4–7.0)
Neutrophils: 57 %
PLATELETS: 292 10*3/uL (ref 150–379)
RBC: 6.16 x10E6/uL — ABNORMAL HIGH (ref 4.14–5.80)
RDW: 13.6 % (ref 12.3–15.4)
WBC: 7.9 10*3/uL (ref 3.4–10.8)

## 2015-06-12 LAB — LIPASE: Lipase: 20 U/L (ref 0–59)

## 2015-06-12 LAB — FERRITIN: Ferritin: 242 ng/mL (ref 30–400)

## 2015-06-13 LAB — IRON AND TIBC
%SAT: 40 % (ref 15–60)
Iron: 106 ug/dL (ref 50–195)
TIBC: 268 ug/dL (ref 250–425)
UIBC: 162 ug/dL (ref 125–400)

## 2015-06-18 ENCOUNTER — Other Ambulatory Visit: Payer: Self-pay

## 2015-06-18 DIAGNOSIS — R109 Unspecified abdominal pain: Secondary | ICD-10-CM

## 2015-06-18 DIAGNOSIS — R1310 Dysphagia, unspecified: Secondary | ICD-10-CM

## 2015-06-18 DIAGNOSIS — D649 Anemia, unspecified: Secondary | ICD-10-CM

## 2015-06-18 DIAGNOSIS — R194 Change in bowel habit: Secondary | ICD-10-CM

## 2015-07-12 ENCOUNTER — Encounter: Payer: Self-pay | Admitting: Gastroenterology

## 2015-07-12 ENCOUNTER — Ambulatory Visit (AMBULATORY_SURGERY_CENTER): Payer: Self-pay | Admitting: Gastroenterology

## 2015-07-12 VITALS — BP 133/83 | HR 58 | Temp 99.2°F | Resp 16 | Ht 70.0 in | Wt 163.0 lb

## 2015-07-12 DIAGNOSIS — R109 Unspecified abdominal pain: Secondary | ICD-10-CM

## 2015-07-12 DIAGNOSIS — R194 Change in bowel habit: Secondary | ICD-10-CM

## 2015-07-12 DIAGNOSIS — R1310 Dysphagia, unspecified: Secondary | ICD-10-CM

## 2015-07-12 MED ORDER — SODIUM CHLORIDE 0.9 % IV SOLN: 500.0000 mL | INTRAVENOUS | Status: DC

## 2015-07-12 NOTE — Progress Notes (Signed)
To pacu-awake with spont resp. Vss, report to RN

## 2015-07-12 NOTE — Patient Instructions (Signed)
Impressions/Recommendations:  Upper endoscopy:  Biopsies taken of stomach to rule out H. Pylori.  Colonoscopy:  Normal colonoscopy  YOU HAD AN ENDOSCOPIC PROCEDURE TODAY AT THE [facility]:   Refer to the procedure report that was given to you for any specific questions about what was found during the examination.  If the procedure report does not answer your questions, please call your gastroenterologist to clarify.  If you requested that your care partner not be given the details of your procedure findings, then the procedure report has been included in a sealed envelope for you to review at your convenience later.  YOU SHOULD EXPECT: Some feelings of bloating in the abdomen. Passage of more gas than usual.  Walking can help get rid of the air that was put into your GI tract during the procedure and reduce the bloating. If you had a lower endoscopy (such as a colonoscopy or flexible sigmoidoscopy) you may notice spotting of blood in your stool or on the toilet paper. If you underwent a bowel prep for your procedure, you may not have a normal bowel movement for a few days.  Please Note:  You might notice some irritation and congestion in your nose or some drainage.  This is from the oxygen used during your procedure.  There is no need for concern and it should clear up in a day or so.  SYMPTOMS TO REPORT IMMEDIATELY:   Following lower endoscopy (colonoscopy or flexible sigmoidoscopy):  Excessive amounts of blood in the stool  Significant tenderness or worsening of abdominal pains  Swelling of the abdomen that is new, acute  Fever of 100F or higher   Following upper endoscopy (EGD)  Vomiting of blood or coffee ground material  New chest pain or pain under the shoulder blades  Painful or persistently difficult swallowing  New shortness of breath  Fever of 100F or higher  Black, tarry-looking stools  For urgent or emergent issues, a gastroenterologist can be reached at  any hour by calling [phone].   DIET: Your first meal following the procedure should be a small meal and then it is ok to progress to your normal diet. Heavy or fried foods are harder to digest and may make you feel nauseous or bloated.  Likewise, meals heavy in dairy and vegetables can increase bloating.  Drink plenty of fluids but you should avoid alcoholic beverages for 24 hours.  ACTIVITY:  You should plan to take it easy for the rest of today and you should NOT DRIVE or use heavy machinery until tomorrow (because of the sedation medicines used during the test).    FOLLOW UP: Our staff will call the number listed on your records the next business day following your procedure to check on you and address any questions or concerns that you may have regarding the information given to you following your procedure. If we do not reach you, we will leave a message.  However, if you are feeling well and you are not experiencing any problems, there is no need to return our call.  We will assume that you have returned to your regular daily activities without incident.  If any biopsies were taken you will be contacted by phone or by letter within the next 1-3 weeks.  Please call us at [phone] if you have not heard about the biopsies in 3 weeks.    SIGNATURES/CONFIDENTIALITY: You and/or your care partner have signed paperwork which will be entered into your electronic medical record.  These signatures attest to the fact that that the information above on your After Visit Summary has been reviewed and is understood.  Full responsibility of the confidentiality of this discharge information lies with you and/or your care-partner.

## 2015-07-12 NOTE — Progress Notes (Signed)
Called to room to assist during endoscopic procedure.  Patient ID and intended procedure confirmed with present staff. Received instructions for my participation in the procedure from the performing physician.  

## 2015-07-12 NOTE — Op Note (Signed)
[facility] [address] [place], [id number]   COLONOSCOPY PROCEDURE REPORT  PATIENT: Sean Park, Sean Park  MR#: [id number] BIRTHDATE: [birth year] , 23  yrs. old GENDER: male ENDOSCOPIST: Benancio DeedsSteven P Armbruster, MD REFERRED BY: PROCEDURE DATE:  07/12/2015 PROCEDURE:   Colonoscopy, diagnostic  ASA CLASS:   Class II INDICATIONS:Colorectal Neoplasm Risk Assessment for this procedure is average risk, abdominal pain, and change in bowel habits. MEDICATIONS: Propofol 200 mg IV  DESCRIPTION OF PROCEDURE:   After the risks benefits and alternatives of the procedure were thoroughly explained, informed consent was obtained.  The digital rectal exam revealed no abnormalities of the rectum.   The LB [id number]-[id number]  endoscope was introduced through the anus and advanced to the ileum. No adverse events experienced.   The quality of the prep was adequate The instrument was then slowly withdrawn as the colon was fully examined. Estimated blood loss is zero unless otherwise noted in this procedure report.  COLON FINDINGS: A normal appearing cecum, ileocecal valve, and appendiceal orifice were identified.  The ascending, transverse, descending, sigmoid colon, and rectum appeared unremarkable. No inflammatory changes, no polyps, no mass lesions.  The examined terminal ileum appeared to be normal.  Retroflexed views revealed internal hemorrhoids. The time to cecum = 5.6 Withdrawal time = 11.4   The scope was withdrawn and the procedure completed. COMPLICATIONS: There were no immediate complications.  ENDOSCOPIC IMPRESSION: 1.   Normal colonoscopy 2.   The examined terminal ileum appeared to be normal  Overall no evidence of Crohns disease, no pathology noted to account for the patient's symptoms  RECOMMENDATIONS: Resume diet Resume medications Await pathology from EGD  eSigned:  Benancio DeedsSteven P Armbruster, MD 07/12/2015 2:07 PM   cc: the patient

## 2015-07-12 NOTE — Op Note (Signed)
[facility] [address] [place], [id number]   ENDOSCOPY PROCEDURE REPORT  PATIENT: Sean Park, Sean Park  MR#: [id number] BIRTHDATE: Jan 15, 1991 , 23  yrs. old GENDER: male ENDOSCOPIST: Benancio DeedsSteven P Jerre Vandrunen, MD REFERRED BY: PROCEDURE DATE:  07/12/2015 PROCEDURE:  EGD w/ biopsy ASA CLASS:     Class II INDICATIONS:  dysphagia, abdominal pain, and heartburn. Brother with Crohns disease MEDICATIONS: Propofol 300 mg IV TOPICAL ANESTHETIC:  DESCRIPTION OF PROCEDURE: After the risks benefits and alternatives of the procedure were thoroughly explained, informed consent was obtained.  The LB [id number] endoscope was introduced through the mouth and advanced to the second portion of the duodenum , Without limitations.  The instrument was slowly withdrawn as the mucosa was fully examined.  FINDINGS:The esophagus was normal in appearance.  No evidence of stenosis or inflammatory changes.  DH, GEJ, and SCJ located 42cm from the incisors.  The stomach had a slightly granular appearance but no evidence of ulcerations or erosions.  Biopsies taken to rule out H pylori.  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  The remainder of the duodenal bulb and 2nd portion of the duodenum were normal.  Biopsies were taken to rule out celiac disease. Retroflexed views revealed no abnormalities.     The scope was then withdrawn from the patient and the procedure completed.  COMPLICATIONS: There were no immediate complications.  ENDOSCOPIC IMPRESSION: Normal esophagus - no evidence of pathology to cause dysphagia Granular appearing gastric mucosa, otherwise normal stomach. 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

## 2015-07-13 ENCOUNTER — Telehealth: Payer: Self-pay | Admitting: *Deleted

## 2015-07-13 NOTE — Telephone Encounter (Signed)
  Follow up Call-  Call back number 07/12/2015  Post procedure Call Back phone  # [phone]  Permission to leave phone message Yes     Patient questions:  Do you have a fever, pain , or abdominal swelling? No. Pain Score  0 *  Have you tolerated food without any problems? Yes.    Have you been able to return to your normal activities? Yes.    Do you have any questions about your discharge instructions: Diet   No. Medications  No. Follow up visit  No.  Do you have questions or concerns about your Care? No.  Actions: * If pain score is 4 or above: No action needed, pain <4.

## 2015-08-29 ENCOUNTER — Ambulatory Visit: Payer: Self-pay | Attending: Family Medicine | Admitting: Family Medicine

## 2015-08-29 ENCOUNTER — Encounter: Payer: Self-pay | Admitting: Family Medicine

## 2015-08-29 VITALS — BP 108/69 | HR 82 | Temp 99.0°F | Resp 16 | Ht 70.5 in | Wt 159.0 lb

## 2015-08-29 DIAGNOSIS — M79609 Pain in unspecified limb: Secondary | ICD-10-CM | POA: Insufficient documentation

## 2015-08-29 DIAGNOSIS — F419 Anxiety disorder, unspecified: Secondary | ICD-10-CM | POA: Insufficient documentation

## 2015-08-29 DIAGNOSIS — Z87891 Personal history of nicotine dependence: Secondary | ICD-10-CM | POA: Insufficient documentation

## 2015-08-29 DIAGNOSIS — Z79899 Other long term (current) drug therapy: Secondary | ICD-10-CM | POA: Insufficient documentation

## 2015-08-29 DIAGNOSIS — R21 Rash and other nonspecific skin eruption: Secondary | ICD-10-CM

## 2015-08-29 DIAGNOSIS — K219 Gastro-esophageal reflux disease without esophagitis: Secondary | ICD-10-CM

## 2015-08-29 DIAGNOSIS — L309 Dermatitis, unspecified: Secondary | ICD-10-CM | POA: Insufficient documentation

## 2015-08-29 DIAGNOSIS — Z114 Encounter for screening for human immunodeficiency virus [HIV]: Secondary | ICD-10-CM

## 2015-08-29 MED ORDER — HYDROCORTISONE VALERATE 0.2 % EX OINT: 1.0000 "application " | TOPICAL_OINTMENT | Freq: Two times a day (BID) | CUTANEOUS | Status: DC

## 2015-08-29 NOTE — Progress Notes (Signed)
F/U abdominal pain and burning  Complaining HA and needles pricking on different places on body. Pain scale #4  No tobacco user  No suicidal thought in the past two weeks

## 2015-08-29 NOTE — Progress Notes (Signed)
Patient ID: Sean Park, male   DOB: [birth year], 24 y.o.   MRN: [id number]   Subjective:  Patient ID: Sean Park, male    DOB: December 17, 1990  Age: 24 y.o. MRN: [id number]  CC: Gastroesophageal Reflux   HPI Gareld Obrecht presents for f/u. He has hx of anxiety.    1. GERD: had EGD and colonoscopy both normal. Symptoms improved with prilosec. He is being more careful about his diet and avoiding identified food triggers. No CP or abdominal pain currently.   2. Rash: small rash on side of L eye. Skin is dry. Rash is slightly pruritic. It is not spreading. No other areas of rash.  3. Intermittent pains: intermittent needle like pains in extremities and flanks. Comes and go quickly. No residual pain. No trauma. No numbness.   Social History  Substance Use Topics  . Smoking status: Former Smoker    Types: Cigarettes    Quit date: 09/15/2014  . Smokeless tobacco: Never Used  . Alcohol Use: No   Outpatient Prescriptions Prior to Visit  Medication Sig Dispense Refill  . albuterol (PROVENTIL HFA;VENTOLIN HFA) 108 (90 BASE) MCG/ACT inhaler Inhale 2 puffs into the lungs every 6 (six) hours as needed for wheezing or shortness of breath.    . Alum & Mag Hydroxide-Simeth (ANTACID ANTI-GAS PO) Take by mouth as needed.    . Multiple Vitamin (MULTIVITAMIN WITH MINERALS) TABS tablet Take 1 tablet by mouth daily.    Marland Kitchen omeprazole (PRILOSEC) 40 MG capsule Take 1 capsule (40 mg total) by mouth daily. 90 capsule 2  . cyclobenzaprine (FLEXERIL) 10 MG tablet Take 1 tablet (10 mg total) by mouth 2 (two) times daily as needed for muscle spasms. (Patient not taking: Reported on 08/29/2015) 20 tablet 0   No facility-administered medications prior to visit.    ROS Review of Systems  Constitutional: Negative for fever, chills, fatigue and unexpected weight change.  Eyes: Negative for visual disturbance.  Respiratory: Negative for cough and shortness of breath.   Cardiovascular: Negative for chest pain,  palpitations and leg swelling.  Gastrointestinal: Negative for nausea, vomiting, abdominal pain, diarrhea, constipation and blood in stool.  Endocrine: Negative for polydipsia, polyphagia and polyuria.  Musculoskeletal: Positive for myalgias. Negative for back pain, arthralgias, gait problem and neck pain.  Skin: Positive for rash.  Allergic/Immunologic: Negative for immunocompromised state.  Neurological: Negative for numbness and headaches.  Hematological: Negative for adenopathy. Does not bruise/bleed easily.  Psychiatric/Behavioral: Negative for suicidal ideas, sleep disturbance and dysphoric mood. The patient is not nervous/anxious.     Objective:  BP 108/69 mmHg  Pulse 82  Temp(Src) 99 F (37.2 C) (Oral)  Resp 16  Ht 5' 10.5" (1.791 m)  Wt 159 lb (72.122 kg)  BMI 22.48 kg/m2  SpO2 98%  BP/Weight 08/29/2015 07/12/2015 06/11/2015  Systolic BP 108 133 110  Diastolic BP 69 83 70  Wt. (Lbs) 159 163 163.25  BMI 22.48 23.39 23.27    Physical Exam  Constitutional: He appears well-developed and well-nourished. No distress.  HENT:  Head: Normocephalic and atraumatic.  Neck: Normal range of motion. Neck supple.  Cardiovascular: Normal rate, regular rhythm, normal heart sounds and intact distal pulses.   Pulmonary/Chest: Effort normal and breath sounds normal.  Musculoskeletal: He exhibits no edema.  Neurological: He is alert.  Skin: Skin is warm and dry. No rash noted. No erythema.     Psychiatric: He has a normal mood and affect.     Assessment & Plan:   Problem List Items  Addressed This Visit    GERD (gastroesophageal reflux disease) (Chronic)    Improved Continue PPI       Rash    A: small area of dermatitis on face P: westcort cream       Relevant Medications   hydrocortisone valerate ointment (WESTCORT) 0.2 %    Other Visit Diagnoses    Screening for HIV (human immunodeficiency virus)    -  Primary    Relevant Orders    HIV antibody (with reflex)        No orders of the defined types were placed in this encounter.    Follow-up: No Follow-up on file.   Dessa PhiJosalyn Charo Philipp MD

## 2015-08-29 NOTE — Patient Instructions (Signed)
Sean Park was seen today for gastroesophageal reflux.  Diagnoses and all orders for this visit:  Screening for HIV (human immunodeficiency virus) -     Cancel: HIV antibody (with reflex) -     HIV antibody (with reflex); Future  Rash -     hydrocortisone valerate ointment (WESTCORT) 0.2 %; Apply 1 application topically 2 (two) times daily.    Please come at your convenience for screening HIV test Use mild steroid cream for face rash, avoid eye, stop if rash worsens  F/u in 3 months sooner if needed for GERD   Dr. Armen PickupFunches

## 2015-08-30 DIAGNOSIS — R21 Rash and other nonspecific skin eruption: Secondary | ICD-10-CM | POA: Insufficient documentation

## 2015-08-30 NOTE — Assessment & Plan Note (Signed)
Improved Continue PPI

## 2015-08-30 NOTE — Assessment & Plan Note (Signed)
A: small area of dermatitis on face P: westcort cream

## 2015-09-18 ENCOUNTER — Encounter (HOSPITAL_COMMUNITY): Payer: Self-pay | Admitting: *Deleted

## 2015-09-18 ENCOUNTER — Emergency Department (HOSPITAL_COMMUNITY)
Admission: EM | Admit: 2015-09-18 | Discharge: 2015-09-18 | Disposition: A | Discharge status: Home / Self Care | Payer: Self-pay | Attending: Emergency Medicine | Admitting: Emergency Medicine

## 2015-09-18 ENCOUNTER — Emergency Department (HOSPITAL_COMMUNITY): Payer: Self-pay

## 2015-09-18 DIAGNOSIS — Z87891 Personal history of nicotine dependence: Secondary | ICD-10-CM | POA: Insufficient documentation

## 2015-09-18 DIAGNOSIS — Z7952 Long term (current) use of systemic steroids: Secondary | ICD-10-CM | POA: Insufficient documentation

## 2015-09-18 DIAGNOSIS — Z862 Personal history of diseases of the blood and blood-forming organs and certain disorders involving the immune mechanism: Secondary | ICD-10-CM | POA: Insufficient documentation

## 2015-09-18 DIAGNOSIS — Z79899 Other long term (current) drug therapy: Secondary | ICD-10-CM | POA: Insufficient documentation

## 2015-09-18 DIAGNOSIS — Z8739 Personal history of other diseases of the musculoskeletal system and connective tissue: Secondary | ICD-10-CM | POA: Insufficient documentation

## 2015-09-18 DIAGNOSIS — Z8709 Personal history of other diseases of the respiratory system: Secondary | ICD-10-CM | POA: Insufficient documentation

## 2015-09-18 DIAGNOSIS — R1013 Epigastric pain: Secondary | ICD-10-CM | POA: Insufficient documentation

## 2015-09-18 DIAGNOSIS — R221 Localized swelling, mass and lump, neck: Secondary | ICD-10-CM | POA: Insufficient documentation

## 2015-09-18 DIAGNOSIS — K219 Gastro-esophageal reflux disease without esophagitis: Secondary | ICD-10-CM | POA: Insufficient documentation

## 2015-09-18 DIAGNOSIS — Z8659 Personal history of other mental and behavioral disorders: Secondary | ICD-10-CM | POA: Insufficient documentation

## 2015-09-18 DIAGNOSIS — R070 Pain in throat: Secondary | ICD-10-CM | POA: Insufficient documentation

## 2015-09-18 DIAGNOSIS — R1012 Left upper quadrant pain: Secondary | ICD-10-CM | POA: Insufficient documentation

## 2015-09-18 HISTORY — DX: Unspecified rotator cuff tear or rupture of unspecified shoulder, not specified as traumatic: M75.100

## 2015-09-18 LAB — CBC
HEMATOCRIT: 42.5 % (ref 39.0–52.0)
Hemoglobin: 13.3 g/dL (ref 13.0–17.0)
MCH: 23.5 pg — ABNORMAL LOW (ref 26.0–34.0)
MCHC: 31.3 g/dL (ref 30.0–36.0)
MCV: 75.2 fL — AB (ref 78.0–100.0)
PLATELETS: 211 10*3/uL (ref 150–400)
RBC: 5.65 MIL/uL (ref 4.22–5.81)
RDW: 13 % (ref 11.5–15.5)
WBC: 5.6 10*3/uL (ref 4.0–10.5)

## 2015-09-18 LAB — I-STAT TROPONIN, ED: Troponin i, poc: 0 ng/mL (ref 0.00–0.08)

## 2015-09-18 LAB — BASIC METABOLIC PANEL
Anion gap: 8 (ref 5–15)
BUN: 12 mg/dL (ref 6–20)
CHLORIDE: 104 mmol/L (ref 101–111)
CO2: 29 mmol/L (ref 22–32)
Calcium: 9.3 mg/dL (ref 8.9–10.3)
Creatinine, Ser: 0.9 mg/dL (ref 0.61–1.24)
Glucose, Bld: 103 mg/dL — ABNORMAL HIGH (ref 65–99)
POTASSIUM: 3.5 mmol/L (ref 3.5–5.1)
SODIUM: 141 mmol/L (ref 135–145)

## 2015-09-18 MED ORDER — RANITIDINE HCL 150 MG/10ML PO SYRP
300.0000 mg | ORAL_SOLUTION | Freq: Once | ORAL | Status: AC
  Administered 2015-09-18: 300 mg via ORAL
  Filled 2015-09-18: qty 20

## 2015-09-18 MED ORDER — SUCRALFATE 1 GM/10ML PO SUSP: 1.0000 g | Freq: Two times a day (BID) | ORAL | Status: DC | PRN

## 2015-09-18 MED ORDER — ALUM & MAG HYDROXIDE-SIMETH 200-200-20 MG/5ML PO SUSP
30.0000 mL | Freq: Once | ORAL | Status: AC
  Administered 2015-09-18: 30 mL via ORAL
  Filled 2015-09-18: qty 30

## 2015-09-18 MED ORDER — SUCRALFATE 1 GM/10ML PO SUSP
1.0000 g | Freq: Once | ORAL | Status: AC
  Administered 2015-09-18: 1 g via ORAL
  Filled 2015-09-18: qty 10

## 2015-09-18 MED ORDER — METOCLOPRAMIDE HCL 10 MG PO TABS
10.0000 mg | ORAL_TABLET | Freq: Once | ORAL | Status: AC
  Administered 2015-09-18: 10 mg via ORAL
  Filled 2015-09-18: qty 1

## 2015-09-18 NOTE — Discharge Instructions (Signed)
Abdominal Pain, Adult Mr. Sean Park, take your normal medication daily and if symptoms become severe, take sucralfate.  See your primary care doctor within 3 days for close follow up. If symptoms worsen, come back to the ED immediately. Thank you. Many things can cause belly (abdominal) pain. Most times, the belly pain is not dangerous. Many cases of belly pain can be watched and treated at home. HOME CARE   Do not take medicines that help you go poop (laxatives) unless told to by your doctor.  Only take medicine as told by your doctor.  Eat or drink as told by your doctor. Your doctor will tell you if you should be on a special diet. GET HELP IF:  You do not know what is causing your belly pain.  You have belly pain while you are sick to your stomach (nauseous) or have runny poop (diarrhea).  You have pain while you pee or poop.  Your belly pain wakes you up at night.  You have belly pain that gets worse or better when you eat.  You have belly pain that gets worse when you eat fatty foods.  You have a fever. GET HELP RIGHT AWAY IF:   The pain does not go away within 2 hours.  You keep throwing up (vomiting).  The pain changes and is only in the right or left part of the belly.  You have bloody or tarry looking poop. MAKE SURE YOU:   Understand these instructions.  Will watch your condition.  Will get help right away if you are not doing well or get worse.   This information is not intended to replace advice given to you by your health care provider. Make sure you discuss any questions you have with your health care provider.   Document Released: 02/18/2008 Document Revised: 09/22/2014 Document Reviewed: 05/11/2013 Elsevier Interactive Patient Education [employer]2016 [employer] Inc.

## 2015-09-18 NOTE — ED Notes (Signed)
p c/o chest pain x 1 hour, states that his neck feels swollen. Pt in nad.

## 2015-09-18 NOTE — ED Provider Notes (Signed)
CSN: [id number]     Arrival date & time 09/18/15  0222 History  By signing my name below, I, Ronney Lion, attest that this documentation has been prepared under the direction and in the presence of Tomasita Crumble, MD. Electronically Signed: Ronney Lion, ED Scribe. 09/18/2015. 4:10 AM.    Chief Complaint  Patient presents with  . Chest Pain   The history is provided by the patient. No language interpreter was used.    HPI Comments: Sean Park is a 25 y.o. male with a history of GERD, anxiety, and dysphagia, who presents to the Emergency Department complaining of sudden-onset, constant, burning, 7/10 epigastric abdominal pain that began when patient was sleeping, waking him up from sleep PTA. He also complains of constant burning throat pain and swelling. He reports a history of GERD and states this feels similar, but his pain has never been as severe as it was tonight. He states he ate a sandwich tonight. He reports he did not try any medications for his symptoms PTA, although he does take a daily Prilosec for GERD. Patient had seen a GI doctor 3 months ago and had an endoscopy that was negative. He denies a history of kidney stones. He denies any dysuria or vomiting.  Past Medical History  Diagnosis Date  . Bronchitis   . GERD (gastroesophageal reflux disease)   . Anxiety   . Dysphagia   . Anemia   . Muscle spasm   . Rotator cuff tear    History reviewed. No pertinent past surgical history. Family History  Problem Relation Age of Onset  . Diabetes Father   . Anxiety disorder Father   . Breast cancer Mother   . Crohn's disease Brother   . Colon cancer Neg Hx   . Esophageal cancer Neg Hx   . Stomach cancer Neg Hx   . Rectal cancer Neg Hx    Social History  Substance Use Topics  . Smoking status: Former Smoker    Types: Cigarettes    Quit date: 09/15/2014  . Smokeless tobacco: Never Used  . Alcohol Use: No    Review of Systems A complete 10 system review of systems was obtained  and all systems are negative except as noted in the HPI and PMH.    Allergies  Review of patient's allergies indicates no known allergies.  Home Medications   Prior to Admission medications   Medication Sig Start Date End Date Taking? Authorizing Provider  albuterol (PROVENTIL HFA;VENTOLIN HFA) 108 (90 BASE) MCG/ACT inhaler Inhale 2 puffs into the lungs every 6 (six) hours as needed for wheezing or shortness of breath.    Historical Provider, MD  Alum & Mag Hydroxide-Simeth (ANTACID ANTI-GAS PO) Take by mouth as needed.    Historical Provider, MD  hydrocortisone valerate ointment (WESTCORT) 0.2 % Apply 1 application topically 2 (two) times daily. 08/29/15   Dessa Phi, MD  Multiple Vitamin (MULTIVITAMIN WITH MINERALS) TABS tablet Take 1 tablet by mouth daily.    Historical Provider, MD  omeprazole (PRILOSEC) 40 MG capsule Take 1 capsule (40 mg total) by mouth daily. 06/11/15   Ruffin Frederick, MD   BP 122/83 mmHg  Pulse 56  Temp(Src) 97.4 F (36.3 C) (Oral)  Resp 18  SpO2 100% Physical Exam  Constitutional: He is oriented to person, place, and time. Vital signs are normal. He appears well-developed and well-nourished.  Non-toxic appearance. He does not appear ill. No distress.  HENT:  Head: Normocephalic and atraumatic.  Nose: Nose normal.  Mouth/Throat: Oropharynx is clear and moist. No oropharyngeal exudate.  Eyes: Conjunctivae and EOM are normal. Pupils are equal, round, and reactive to light. No scleral icterus.  Neck: Normal range of motion. Neck supple. No tracheal deviation, no edema, no erythema and normal range of motion present. No thyroid mass and no thyromegaly present.  Cardiovascular: Normal rate, regular rhythm, S1 normal, S2 normal, normal heart sounds, intact distal pulses and normal pulses.  Exam reveals no gallop and no friction rub.   No murmur heard. Pulmonary/Chest: Effort normal and breath sounds normal. No respiratory distress. He has no wheezes. He  has no rhonchi. He has no rales.  Abdominal: Soft. Normal appearance and bowel sounds are normal. He exhibits no distension, no ascites and no mass. There is no hepatosplenomegaly. There is tenderness. There is no rebound, no guarding and no CVA tenderness.  Mid epigastric and LUQ TTP.   Musculoskeletal: Normal range of motion. He exhibits no edema or tenderness.  Lymphadenopathy:    He has no cervical adenopathy.  Neurological: He is alert and oriented to person, place, and time. He has normal strength. No cranial nerve deficit or sensory deficit.  Skin: Skin is warm, dry and intact. No petechiae and no rash noted. He is not diaphoretic. No erythema. No pallor.  Psychiatric: He has a normal mood and affect. His behavior is normal. Judgment normal.  Nursing note and vitals reviewed.   ED Course  Procedures (including critical care time)  DIAGNOSTIC STUDIES: Oxygen Saturation is 100% on RA, normal by my interpretation.    COORDINATION OF CARE: 3:55 AM - Discussed treatment plan with pt at bedside. Pt verbalized understanding and agreed to plan.   Labs Review Labs Reviewed  BASIC METABOLIC PANEL - Abnormal; Notable for the following:    Glucose, Bld 103 (*)    All other components within normal limits  CBC - Abnormal; Notable for the following:    MCV 75.2 (*)    MCH 23.5 (*)    All other components within normal limits  I-STAT TROPOININ, ED    Imaging Review Dg Chest 2 View  09/18/2015  CLINICAL DATA:  Acute onset of generalized chest pain and neck swelling. Burning sensation from the chest extending to the throat. Initial encounter. EXAM: CHEST  2 VIEW COMPARISON:  Chest radiograph performed 02/27/2015 FINDINGS: The lungs are well-aerated and clear. There is no evidence of focal opacification, pleural effusion or pneumothorax. The heart is normal in size; the mediastinal contour is within normal limits. No acute osseous abnormalities are seen. IMPRESSION: No acute cardiopulmonary  process seen. Electronically Signed   By: Roanna Raider M.D.   On: 09/18/2015 03:06   I have personally reviewed and evaluated these images and lab results as part of my medical decision-making.   EKG Interpretation   Date/Time:  Tuesday September 18 2015 02:25:02 EST Ventricular Rate:  60 PR Interval:  166 QRS Duration: 90 QT Interval:  418 QTC Calculation: 418 R Axis:   86 Text Interpretation:  Normal sinus rhythm Normal ECG No significant change  since last tracing Confirmed by Erroll Luna [phone]) on 09/18/2015  4:13:23 AM      MDM   Final diagnoses:  None    Patient presents to the ED for abdominal pain. He states it is consistent with his h/o GERD.  EKG here is unremarkable and history is not consistent with ACS.  He was given zantac, carafate, maalox, and reglan for treatment.  Upon repeat evaluation, he states  his pain has significantly improved and feels comfortable going home.  Will DC with carafate to use as needed.  He takes prilosec at baseline as well.  He appears well and in NAD.  VS remain within his normal limits and he is safe for DC.   I personally performed the services described in this documentation, which was scribed in my presence. The recorded information has been reviewed and is accurate.       Tomasita CrumbleAdeleke Kaelani Kendrick, MD 09/18/15 [phone]

## 2015-09-18 NOTE — ED Notes (Signed)
Pt also c/o burning sensation from abd to throat. Hx acid reflux.

## 2015-09-19 ENCOUNTER — Ambulatory Visit: Payer: Self-pay | Attending: Family Medicine

## 2015-09-19 MED FILL — CARAFATE 1 GM/10 ML SUSP: 1 | 21 days supply | Qty: 420 | Fill #0

## 2015-09-25 ENCOUNTER — Ambulatory Visit: Payer: Self-pay | Admitting: Family Medicine

## 2015-10-03 MED FILL — OMEPRAZOLE DR 40 MG CAPSULE: 40 | 30 days supply | Qty: 30 | Fill #1

## 2015-10-12 ENCOUNTER — Ambulatory Visit: Payer: Self-pay | Attending: Family Medicine | Admitting: Family Medicine

## 2015-10-12 ENCOUNTER — Encounter: Payer: Self-pay | Admitting: Family Medicine

## 2015-10-12 VITALS — BP 124/76 | HR 68 | Temp 98.7°F | Resp 16 | Ht 70.5 in | Wt 157.0 lb

## 2015-10-12 DIAGNOSIS — K219 Gastro-esophageal reflux disease without esophagitis: Secondary | ICD-10-CM | POA: Insufficient documentation

## 2015-10-12 DIAGNOSIS — K0889 Other specified disorders of teeth and supporting structures: Secondary | ICD-10-CM

## 2015-10-12 DIAGNOSIS — Z79899 Other long term (current) drug therapy: Secondary | ICD-10-CM | POA: Insufficient documentation

## 2015-10-12 DIAGNOSIS — Z0000 Encounter for general adult medical examination without abnormal findings: Secondary | ICD-10-CM

## 2015-10-12 DIAGNOSIS — Z87891 Personal history of nicotine dependence: Secondary | ICD-10-CM | POA: Insufficient documentation

## 2015-10-12 LAB — POCT GLYCOSYLATED HEMOGLOBIN (HGB A1C): HEMOGLOBIN A1C: 4.9

## 2015-10-12 MED ORDER — AMOXICILLIN 500 MG PO CAPS: 500.0000 mg | ORAL_CAPSULE | Freq: Two times a day (BID) | ORAL | Status: AC

## 2015-10-12 MED ORDER — ACETAMINOPHEN-CODEINE #3 300-30 MG PO TABS: 1.0000 | ORAL_TABLET | Freq: Three times a day (TID) | ORAL | Status: AC | PRN

## 2015-10-12 MED FILL — ACETAMINOPHEN/COD #3 TABLET: 300-30 | 6 days supply | Qty: 20 | Fill #0

## 2015-10-12 MED FILL — AMOXICILLIN 500 MG CAPSULE: 500 | 10 days supply | Qty: 20 | Fill #0

## 2015-10-12 NOTE — Progress Notes (Signed)
Patient ID: Sean Park, male   DOB: 05-Mar-1991, 25 y.o.   MRN: [id number]   Subjective:  Patient ID: Sean Park, male    DOB: 1991-04-07  Age: 25 y.o. MRN: [id number]  CC: No chief complaint on file.   HPI Sean Park presents for f/u. He has hx of anxiety.    1. GERD: had EGD and colonoscopy both normal. Symptoms improved with prilosec. He went to ED on 09/27/2015 for GERD. He took carafate and prilosec. He has no CP.   2. Dental pain: x 2.5 weeks. R lower molar. No swelling. No fever or chills. Last saw a dentist about 10 years ago.   Social History  Substance Use Topics  . Smoking status: Former Smoker    Types: Cigarettes    Quit date: 09/15/2014  . Smokeless tobacco: Never Used  . Alcohol Use: No   Outpatient Prescriptions Prior to Visit  Medication Sig Dispense Refill  . albuterol (PROVENTIL HFA;VENTOLIN HFA) 108 (90 BASE) MCG/ACT inhaler Inhale 2 puffs into the lungs every 6 (six) hours as needed for wheezing or shortness of breath.    . Multiple Vitamin (MULTIVITAMIN WITH MINERALS) TABS tablet Take 1 tablet by mouth daily.    Marland Kitchen omeprazole (PRILOSEC) 40 MG capsule Take 1 capsule (40 mg total) by mouth daily. 90 capsule 2  . sucralfate (CARAFATE) 1 GM/10ML suspension Take 10 mLs (1 g total) by mouth 2 (two) times daily as needed (severe heartburn). 420 mL 0   No facility-administered medications prior to visit.    ROS Review of Systems  Constitutional: Negative for fever, chills, fatigue and unexpected weight change.  HENT: Positive for dental problem.   Eyes: Negative for visual disturbance.  Respiratory: Negative for cough and shortness of breath.   Cardiovascular: Negative for chest pain, palpitations and leg swelling.  Gastrointestinal: Negative for nausea, vomiting, abdominal pain, diarrhea, constipation and blood in stool.  Endocrine: Negative for polydipsia, polyphagia and polyuria.  Musculoskeletal: Negative for myalgias, back pain, arthralgias, gait  problem and neck pain.  Skin: Negative for rash.  Allergic/Immunologic: Negative for immunocompromised state.  Neurological: Negative for numbness and headaches.  Hematological: Negative for adenopathy. Does not bruise/bleed easily.  Psychiatric/Behavioral: Negative for suicidal ideas, sleep disturbance and dysphoric mood. The patient is not nervous/anxious.     Objective:  BP 124/76 mmHg  Pulse 68  Temp(Src) 98.7 F (37.1 C) (Oral)  Resp 16  Ht 5' 10.5" (1.791 m)  Wt 157 lb (71.215 kg)  BMI 22.20 kg/m2  SpO2 100%  BP/Weight 10/12/2015 09/18/2015 08/29/2015  Systolic BP 124 106 108  Diastolic BP 76 67 69  Wt. (Lbs) 157 - 159  BMI 22.2 - 22.48    Physical Exam  Constitutional: He appears well-developed and well-nourished. No distress.  HENT:  Head: Normocephalic and atraumatic.  Mouth/Throat: He does not have dentures. No oral lesions. Abnormal dentition. No dental caries.    Neck: Normal range of motion. Neck supple.  Cardiovascular: Normal rate, regular rhythm, normal heart sounds and intact distal pulses.   Pulmonary/Chest: Effort normal and breath sounds normal.  Musculoskeletal: He exhibits no edema.  Neurological: He is alert.  Skin: Skin is warm and dry. No rash noted. No erythema.  Psychiatric: He has a normal mood and affect.   Lab Results  Component Value Date   HGBA1C 4.90 10/12/2015    Assessment & Plan:   Problem List Items Addressed This Visit    None    Visit Diagnoses    Healthcare  maintenance    -  Primary    Relevant Orders    POCT glycosylated hemoglobin (Hb A1C) (Completed)    Pain of molar        Relevant Medications    amoxicillin (AMOXIL) 500 MG capsule    acetaminophen-codeine (TYLENOL #3) 300-30 MG tablet    Other Relevant Orders    Ambulatory referral to Dentistry       No orders of the defined types were placed in this encounter.    Follow-up: No Follow-up on file.   Dessa Phi MD

## 2015-10-12 NOTE — Progress Notes (Signed)
C/C tooth pain x 2 weeks Pain scale #10 No tobacco user  No suicidal thought in the past two weeks

## 2015-10-12 NOTE — Patient Instructions (Signed)
Sean Park was seen today for dental pain.  Diagnoses and all orders for this visit:  Healthcare maintenance -     POCT glycosylated hemoglobin (Hb A1C)  Pain of molar -     amoxicillin (AMOXIL) 500 MG capsule; Take 1 capsule (500 mg total) by mouth 2 (two) times daily. -     acetaminophen-codeine (TYLENOL #3) 300-30 MG tablet; Take 1 tablet by mouth every 8 (eight) hours as needed for moderate pain. -     Ambulatory referral to Dentistry   Negative diabetes screen   F/u in 6 months for GERD, sooner if needed  Dr. Armen Pickup

## 2016-01-02 ENCOUNTER — Emergency Department (HOSPITAL_COMMUNITY): Payer: Self-pay

## 2016-01-02 ENCOUNTER — Encounter (HOSPITAL_COMMUNITY): Payer: Self-pay | Admitting: Emergency Medicine

## 2016-01-02 DIAGNOSIS — J209 Acute bronchitis, unspecified: Secondary | ICD-10-CM | POA: Insufficient documentation

## 2016-01-02 DIAGNOSIS — Z79899 Other long term (current) drug therapy: Secondary | ICD-10-CM | POA: Insufficient documentation

## 2016-01-02 DIAGNOSIS — Z87891 Personal history of nicotine dependence: Secondary | ICD-10-CM | POA: Insufficient documentation

## 2016-01-02 DIAGNOSIS — Z8739 Personal history of other diseases of the musculoskeletal system and connective tissue: Secondary | ICD-10-CM | POA: Insufficient documentation

## 2016-01-02 DIAGNOSIS — Z8659 Personal history of other mental and behavioral disorders: Secondary | ICD-10-CM | POA: Insufficient documentation

## 2016-01-02 DIAGNOSIS — Z862 Personal history of diseases of the blood and blood-forming organs and certain disorders involving the immune mechanism: Secondary | ICD-10-CM | POA: Insufficient documentation

## 2016-01-02 DIAGNOSIS — K219 Gastro-esophageal reflux disease without esophagitis: Secondary | ICD-10-CM | POA: Insufficient documentation

## 2016-01-02 LAB — CBC
HEMATOCRIT: 40.4 % (ref 39.0–52.0)
HEMOGLOBIN: 12.4 g/dL — AB (ref 13.0–17.0)
MCH: 23.1 pg — ABNORMAL LOW (ref 26.0–34.0)
MCHC: 30.7 g/dL (ref 30.0–36.0)
MCV: 75.4 fL — ABNORMAL LOW (ref 78.0–100.0)
Platelets: 242 10*3/uL (ref 150–400)
RBC: 5.36 MIL/uL (ref 4.22–5.81)
RDW: 12.4 % (ref 11.5–15.5)
WBC: 6.2 10*3/uL (ref 4.0–10.5)

## 2016-01-02 LAB — I-STAT TROPONIN, ED: TROPONIN I, POC: 0 ng/mL (ref 0.00–0.08)

## 2016-01-02 NOTE — ED Notes (Signed)
Pt. reports intermittent central chest pain with SOB and occasional dry cough onset 2 days ago , denies chest pain at arrival . No nausea or diaphoresis .

## 2016-01-03 ENCOUNTER — Emergency Department (HOSPITAL_COMMUNITY)
Admission: EM | Admit: 2016-01-03 | Discharge: 2016-01-03 | Disposition: A | Discharge status: Home / Self Care | Payer: Self-pay | Attending: Emergency Medicine | Admitting: Emergency Medicine

## 2016-01-03 DIAGNOSIS — J209 Acute bronchitis, unspecified: Secondary | ICD-10-CM

## 2016-01-03 LAB — BASIC METABOLIC PANEL
ANION GAP: 9 (ref 5–15)
BUN: 12 mg/dL (ref 6–20)
CHLORIDE: 101 mmol/L (ref 101–111)
CO2: 28 mmol/L (ref 22–32)
Calcium: 8.7 mg/dL — ABNORMAL LOW (ref 8.9–10.3)
Creatinine, Ser: 0.9 mg/dL (ref 0.61–1.24)
GFR calc non Af Amer: >60 mL/min (ref >60)
Glucose, Bld: 106 mg/dL — ABNORMAL HIGH (ref 65–99)
Potassium: 4 mmol/L (ref 3.5–5.1)
Sodium: 138 mmol/L (ref 135–145)

## 2016-01-03 MED ORDER — PREDNISONE 20 MG PO TABS
60.0000 mg | ORAL_TABLET | Freq: Once | ORAL | Status: AC
  Administered 2016-01-03: 60 mg via ORAL
  Filled 2016-01-03: qty 3

## 2016-01-03 MED ORDER — PREDNISONE 20 MG PO TABS: 40.0000 mg | ORAL_TABLET | Freq: Every day | ORAL | Status: DC

## 2016-01-03 MED ORDER — IPRATROPIUM-ALBUTEROL 0.5-2.5 (3) MG/3ML IN SOLN
3.0000 mL | Freq: Once | RESPIRATORY_TRACT | Status: AC
  Administered 2016-01-03: 3 mL via RESPIRATORY_TRACT
  Filled 2016-01-03: qty 3

## 2016-01-03 MED ORDER — ALBUTEROL SULFATE HFA 108 (90 BASE) MCG/ACT IN AERS
2.0000 | INHALATION_SPRAY | RESPIRATORY_TRACT | Status: DC | PRN
  Administered 2016-01-03: 2 via RESPIRATORY_TRACT
  Filled 2016-01-03: qty 6.7

## 2016-01-03 NOTE — ED Provider Notes (Signed)
CSN: [id number]     Arrival date & time 01/02/16  2317 History  By signing my name below, I, Sean Park, attest that this documentation has been prepared under the direction and in the presence of Sean Booze, MD. Electronically Signed: Bethel Park, ED Scribe. 01/03/2016. 2:30 AM   Chief Complaint  Patient presents with  . Chest Pain   The history is provided by the patient. No language interpreter was used.   Sean Park is a 25 y.o. male with PMHx of bronchitis and GERD who presents to the Emergency Department complaining of new, intermittent, sharp, generalized chest pain (L>R) with onset 2 days ago. Associated symptoms include cough productive of green sputum, SOB that woke him from sleeping tonight and left leg pain. Pt denies sweating, nausea, and vomiting. No history of DVT/PE. No recent long travel by car or plane. No recent surgery or hospitalization. He denies smoking.    Past Medical History  Diagnosis Date  . Bronchitis   . GERD (gastroesophageal reflux disease)   . Anxiety   . Dysphagia   . Anemia   . Muscle spasm   . Rotator cuff tear    History reviewed. No pertinent past surgical history. Family History  Problem Relation Age of Onset  . Diabetes Father   . Anxiety disorder Father   . Breast cancer Mother   . Crohn's disease Brother   . Colon cancer Neg Hx   . Esophageal cancer Neg Hx   . Stomach cancer Neg Hx   . Rectal cancer Neg Hx    Social History  Substance Use Topics  . Smoking status: Former Smoker    Types: Cigarettes    Quit date: 09/15/2014  . Smokeless tobacco: Never Used  . Alcohol Use: No    Review of Systems  Constitutional: Negative for diaphoresis.  Respiratory: Positive for cough and shortness of breath.   Cardiovascular: Positive for chest pain.  Gastrointestinal: Negative for nausea and vomiting.  All other systems reviewed and are negative.   Allergies  Review of patient's allergies indicates no known  allergies.  Home Medications   Prior to Admission medications   Medication Sig Start Date End Date Taking? Authorizing Provider  Multiple Vitamin (MULTIVITAMIN WITH MINERALS) TABS tablet Take 1 tablet by mouth daily.   Yes Historical Provider, MD  omeprazole (PRILOSEC) 40 MG capsule Take 1 capsule (40 mg total) by mouth daily. Patient not taking: Reported on 01/03/2016 06/11/15   Ruffin Frederick, MD  sucralfate (CARAFATE) 1 GM/10ML suspension Take 10 mLs (1 g total) by mouth 2 (two) times daily as needed (severe heartburn). Patient not taking: Reported on 10/12/2015 09/18/15   Tomasita Crumble, MD   BP 112/53 mmHg  Pulse 60  Temp(Src) 98 F (36.7 C) (Oral)  Resp 20  Ht  (1.803 m)  Wt 168 lb (76.204 kg)  BMI 23.44 kg/m2  SpO2 99% Physical Exam  Constitutional: He is oriented to person, place, and time. He appears well-developed and well-nourished.  HENT:  Head: Normocephalic and atraumatic.  Eyes: EOM are normal. Pupils are equal, round, and reactive to light.  Neck: Normal range of motion. Neck supple. No JVD present.  Cardiovascular: Normal rate, regular rhythm, normal heart sounds and intact distal pulses.   Pulmonary/Chest: Effort normal and breath sounds normal. He has no wheezes. He has no rales. He exhibits no tenderness.  Slightly prolonged exhalation phase   Abdominal: Soft. He exhibits no distension and no mass. There is no tenderness.  Musculoskeletal: Normal range of motion. He exhibits no edema.  Lymphadenopathy:    He has no cervical adenopathy.  Neurological: He is alert and oriented to person, place, and time. No cranial nerve deficit. He exhibits normal muscle tone. Coordination normal.  Skin: Skin is warm and dry. No rash noted.  Psychiatric: He has a normal mood and affect. His behavior is normal. Judgment and thought content normal.  Nursing note and vitals reviewed.   ED Course  Procedures (including critical care time) DIAGNOSTIC STUDIES: Oxygen  Saturation is 99% on RA,  normal by my interpretation.    COORDINATION OF CARE: 2:28 AM Discussed treatment plan which includes lab work, CXR, EKG, and a breathing treatment with pt at bedside and pt agreed to plan.  Labs Review Labs Reviewed  BASIC METABOLIC PANEL - Abnormal; Notable for the following:    Glucose, Bld 106 (*)    Calcium 8.7 (*)    All other components within normal limits  CBC - Abnormal; Notable for the following:    Hemoglobin 12.4 (*)    MCV 75.4 (*)    MCH 23.1 (*)    All other components within normal limits  I-STAT TROPOININ, ED    Imaging Review Dg Chest 2 View  01/03/2016  CLINICAL DATA:  Woke up with shortness of breath tonight. Prickly feeling in the chest. EXAM: CHEST  2 VIEW COMPARISON:  09/18/2015 FINDINGS: The heart size and mediastinal contours are within normal limits. Both lungs are clear. The visualized skeletal structures are unremarkable. IMPRESSION: No active cardiopulmonary disease. Electronically Signed   By: Burman NievesWilliam  Stevens M.D.   On: 01/03/2016 00:03   I have personally reviewed and evaluated these images and lab results as part of my medical decision-making.   EKG Interpretation   Date/Time:  Wednesday January 02 2016 23:20:09 EDT Ventricular Rate:  56 PR Interval:  176 QRS Duration: 82 QT Interval:  424 QTC Calculation: 409 R Axis:   83 Text Interpretation:  Sinus bradycardia Otherwise normal ECG When compared  with ECG of 09/18/2015, No significant change was found Confirmed by [facility]   MD, Angee Gupton ([id number]) on 01/03/2016 2:09:22 AM      MDM   Final diagnoses:  Acute bronchitis, unspecified organism    Cough and chest pain. He has no risk factors for coronary artery disease. Heart score equals 1. He does show signs of some bronchospasm on exam. Chest x-ray shows no evidence of pneumonia. Is given albuterol with ipratropium via nebulizer with significant subjective improvement. At this point, symptoms are felt to be related to acute  bronchitis. He is given a dose of prednisone, discharged with an albuterol inhaler in with a prescription for prednisone.  I personally performed the services described in this documentation, which was scribed in my presence. The recorded information has been reviewed and is accurate.      Sean Boozeavid Yazleemar Strassner, MD 01/03/16 [phone]

## 2016-01-03 NOTE — Discharge Instructions (Signed)
Acute Bronchitis °Bronchitis is inflammation of the airways that extend from the windpipe into the lungs (bronchi). The inflammation often causes mucus to develop. This leads to a cough, which is the most common symptom of bronchitis.  °In acute bronchitis, the condition usually develops suddenly and goes away over time, usually in a couple weeks. Smoking, allergies, and asthma can make bronchitis worse. Repeated episodes of bronchitis may cause further lung problems.  °CAUSES °Acute bronchitis is most often caused by the same virus that causes a cold. The virus can spread from person to person (contagious) through coughing, sneezing, and touching contaminated objects. °SIGNS AND SYMPTOMS  °· Cough.   °· Fever.   °· Coughing up mucus.   °· Body aches.   °· Chest congestion.   °· Chills.   °· Shortness of breath.   °· Sore throat.   °DIAGNOSIS  °Acute bronchitis is usually diagnosed through a physical exam. Your health care provider will also ask you questions about your medical history. Tests, such as chest X-rays, are sometimes done to rule out other conditions.  °TREATMENT  °Acute bronchitis usually goes away in a couple weeks. Oftentimes, no medical treatment is necessary. Medicines are sometimes given for relief of fever or cough. Antibiotic medicines are usually not needed but may be prescribed in certain situations. In some cases, an inhaler may be recommended to help reduce shortness of breath and control the cough. A cool mist vaporizer may also be used to help thin bronchial secretions and make it easier to clear the chest.  °HOME CARE INSTRUCTIONS °· Get plenty of rest.   °· Drink enough fluids to keep your urine clear or pale yellow (unless you have a medical condition that requires fluid restriction). Increasing fluids may help thin your respiratory secretions (sputum) and reduce chest congestion, and it will prevent dehydration.   °· Take medicines only as directed by your health care provider. °· If  you were prescribed an antibiotic medicine, finish it all even if you start to feel better. °· Avoid smoking and secondhand smoke. Exposure to cigarette smoke or irritating chemicals will make bronchitis worse. If you are a smoker, consider using nicotine gum or skin patches to help control withdrawal symptoms. Quitting smoking will help your lungs heal faster.   °· Reduce the chances of another bout of acute bronchitis by washing your hands frequently, avoiding people with cold symptoms, and trying not to touch your hands to your mouth, nose, or eyes.   °· Keep all follow-up visits as directed by your health care provider.   °SEEK MEDICAL CARE IF: °Your symptoms do not improve after 1 week of treatment.  °SEEK IMMEDIATE MEDICAL CARE IF: °· You develop an increased fever or chills.   °· You have chest pain.   °· You have severe shortness of breath. °· You have bloody sputum.   °· You develop dehydration. °· You faint or repeatedly feel like you are going to pass out. °· You develop repeated vomiting. °· You develop a severe headache. °MAKE SURE YOU:  °· Understand these instructions. °· Will watch your condition. °· Will get help right away if you are not doing well or get worse. °  °This information is not intended to replace advice given to you by your health care provider. Make sure you discuss any questions you have with your health care provider. °  °Document Released: 10/09/2004 Document Revised: 09/22/2014 Document Reviewed: 02/22/2013 °Elsevier Interactive Patient Education ©2016 [employer] ° °Albuterol inhalation aerosol °What is this medicine? °ALBUTEROL (al BYOO ter ole) is a bronchodilator. It helps open   up the airways in your lungs to make it easier to breathe. This medicine is used to treat and to prevent bronchospasm. °This medicine may be used for other purposes; ask your health care provider or pharmacist if you have questions. °What should I tell my health care provider before I take this  medicine? °They need to know if you have any of the following conditions: °-diabetes °-heart disease or irregular heartbeat °-high blood pressure °-pheochromocytoma °-seizures °-thyroid disease °-an unusual or allergic reaction to albuterol, levalbuterol, sulfites, other medicines, foods, dyes, or preservatives °-pregnant or trying to get pregnant °-breast-feeding °How should I use this medicine? °This medicine is for inhalation through the mouth. Follow the directions on your prescription label. Take your medicine at regular intervals. Do not use more often than directed. Make sure that you are using your inhaler correctly. Ask you doctor or health care provider if you have any questions. °Talk to your pediatrician regarding the use of this medicine in children. Special care may be needed. °Overdosage: If you think you have taken too much of this medicine contact a poison control center or emergency room at once. °NOTE: This medicine is only for you. Do not share this medicine with others. °What if I miss a dose? °If you miss a dose, use it as soon as you can. If it is almost time for your next dose, use only that dose. Do not use double or extra doses. °What may interact with this medicine? °-anti-infectives like chloroquine and pentamidine °-caffeine °-cisapride °-diuretics °-medicines for colds °-medicines for depression or for emotional or psychotic conditions °-medicines for weight loss including some herbal products °-methadone °-some antibiotics like clarithromycin, erythromycin, levofloxacin, and linezolid °-some heart medicines °-steroid hormones like dexamethasone, cortisone, hydrocortisone °-theophylline °-thyroid hormones °This list may not describe all possible interactions. Give your health care provider a list of all the medicines, herbs, non-prescription drugs, or dietary supplements you use. Also tell them if you smoke, drink alcohol, or use illegal drugs. Some items may interact with your  medicine. °What should I watch for while using this medicine? °Tell your doctor or health care professional if your symptoms do not improve. Do not use extra albuterol. If your asthma or bronchitis gets worse while you are using this medicine, call your doctor right away. °If your mouth gets dry try chewing sugarless gum or sucking hard candy. Drink water as directed. °What side effects may I notice from receiving this medicine? °Side effects that you should report to your doctor or health care professional as soon as possible: °-allergic reactions like skin rash, itching or hives, swelling of the face, lips, or tongue °-breathing problems °-chest pain °-feeling faint or lightheaded, falls °-high blood pressure °-irregular heartbeat °-fever °-muscle cramps or weakness °-pain, tingling, numbness in the hands or feet °-vomiting °Side effects that usually do not require medical attention (report to your doctor or health care professional if they continue or are bothersome): °-cough °-difficulty sleeping °-headache °-nervousness or trembling °-stomach upset °-stuffy or runny nose °-throat irritation °-unusual taste °This list may not describe all possible side effects. Call your doctor for medical advice about side effects. You may report side effects to FDA at 1-800-FDA-1088. °Where should I keep my medicine? °Keep out of the reach of children. °Store at room temperature between 15 and 30 degrees C (59 and 86 degrees F). The contents are under pressure and may burst when exposed to heat or flame. Do not freeze. This medicine does not work as   well if it is too cold. Throw away any unused medicine after the expiration date. Inhalers need to be thrown away after the labeled number of puffs have been used or by the expiration date; whichever comes first. Ventolin HFA should be thrown away 12 months after removing from foil pouch. Check the instructions that come with your medicine. °NOTE: This sheet is a summary. It may  not cover all possible information. If you have questions about this medicine, talk to your doctor, pharmacist, or health care provider. °  °© 2016, [employer]. (2013-02-17 10:57:17) ° °Prednisone tablets °What is this medicine? °PREDNISONE (PRED ni sone) is a corticosteroid. It is commonly used to treat inflammation of the skin, joints, lungs, and other organs. Common conditions treated include asthma, allergies, and arthritis. It is also used for other conditions, such as blood disorders and diseases of the adrenal glands. °This medicine may be used for other purposes; ask your health care provider or pharmacist if you have questions. °What should I tell my health care provider before I take this medicine? °They need to know if you have any of these conditions: °-Cushing's syndrome °-diabetes °-glaucoma °-heart disease °-high blood pressure °-infection (especially a virus infection such as chickenpox, cold sores, or herpes) °-kidney disease °-liver disease °-mental illness °-myasthenia gravis °-osteoporosis °-seizures °-stomach or intestine problems °-thyroid disease °-an unusual or allergic reaction to lactose, prednisone, other medicines, foods, dyes, or preservatives °-pregnant or trying to get pregnant °-breast-feeding °How should I use this medicine? °Take this medicine by mouth with a glass of water. Follow the directions on the prescription label. Take this medicine with food. If you are taking this medicine once a day, take it in the morning. Do not take more medicine than you are told to take. Do not suddenly stop taking your medicine because you may develop a severe reaction. Your doctor will tell you how much medicine to take. If your doctor wants you to stop the medicine, the dose may be slowly lowered over time to avoid any side effects. °Talk to your pediatrician regarding the use of this medicine in children. Special care may be needed. °Overdosage: If you think you have taken too much  of this medicine contact a poison control center or emergency room at once. °NOTE: This medicine is only for you. Do not share this medicine with others. °What if I miss a dose? °If you miss a dose, take it as soon as you can. If it is almost time for your next dose, talk to your doctor or health care professional. You may need to miss a dose or take an extra dose. Do not take double or extra doses without advice. °What may interact with this medicine? °Do not take this medicine with any of the following medications: °-metyrapone °-mifepristone °This medicine may also interact with the following medications: °-aminoglutethimide °-amphotericin B °-aspirin and aspirin-like medicines °-barbiturates °-certain medicines for diabetes, like glipizide or glyburide °-cholestyramine °-cholinesterase inhibitors °-cyclosporine °-digoxin °-diuretics °-ephedrine °-male hormones, like estrogens and birth control pills °-isoniazid °-ketoconazole °-NSAIDS, medicines for pain and inflammation, like ibuprofen or naproxen °-phenytoin °-rifampin °-toxoids °-vaccines °-warfarin °This list may not describe all possible interactions. Give your health care provider a list of all the medicines, herbs, non-prescription drugs, or dietary supplements you use. Also tell them if you smoke, drink alcohol, or use illegal drugs. Some items may interact with your medicine. °What should I watch for while using this medicine? °Visit your doctor or health care professional for   regular checks on your progress. If you are taking this medicine over a prolonged period, carry an identification card with your name and address, the type and dose of your medicine, and your doctor's name and address. °This medicine may increase your risk of getting an infection. Tell your doctor or health care professional if you are around anyone with measles or chickenpox, or if you develop sores or blisters that do not heal properly. °If you are going to have surgery, tell  your doctor or health care professional that you have taken this medicine within the last twelve months. °Ask your doctor or health care professional about your diet. You may need to lower the amount of salt you eat. °This medicine may affect blood sugar levels. If you have diabetes, check with your doctor or health care professional before you change your diet or the dose of your diabetic medicine. °What side effects may I notice from receiving this medicine? °Side effects that you should report to your doctor or health care professional as soon as possible: °-allergic reactions like skin rash, itching or hives, swelling of the face, lips, or tongue °-changes in emotions or moods °-changes in vision °-depressed mood °-eye pain °-fever or chills, cough, sore throat, pain or difficulty passing urine °-increased thirst °-swelling of ankles, feet °Side effects that usually do not require medical attention (report to your doctor or health care professional if they continue or are bothersome): °-confusion, excitement, restlessness °-headache °-nausea, vomiting °-skin problems, acne, thin and shiny skin °-trouble sleeping °-weight gain °This list may not describe all possible side effects. Call your doctor for medical advice about side effects. You may report side effects to FDA at 1-800-FDA-1088. °Where should I keep my medicine? °Keep out of the reach of children. °Store at room temperature between 15 and 30 degrees C (59 and 86 degrees F). Protect from light. Keep container tightly closed. Throw away any unused medicine after the expiration date. °NOTE: This sheet is a summary. It may not cover all possible information. If you have questions about this medicine, talk to your doctor, pharmacist, or health care provider. °  °© 2016, [employer]. (2011-04-17 10:57:14) ° °

## 2016-09-18 ENCOUNTER — Ambulatory Visit: Payer: Self-pay | Attending: Family Medicine

## 2016-11-14 ENCOUNTER — Ambulatory Visit: Payer: Self-pay | Attending: Family Medicine | Admitting: Family Medicine

## 2016-11-14 ENCOUNTER — Encounter: Payer: Self-pay | Admitting: Family Medicine

## 2016-11-14 VITALS — BP 115/65 | HR 68 | Temp 98.3°F | Ht 70.5 in | Wt 158.6 lb

## 2016-11-14 DIAGNOSIS — Z87891 Personal history of nicotine dependence: Secondary | ICD-10-CM | POA: Insufficient documentation

## 2016-11-14 DIAGNOSIS — K219 Gastro-esophageal reflux disease without esophagitis: Secondary | ICD-10-CM | POA: Insufficient documentation

## 2016-11-14 DIAGNOSIS — F419 Anxiety disorder, unspecified: Secondary | ICD-10-CM | POA: Insufficient documentation

## 2016-11-14 DIAGNOSIS — M7542 Impingement syndrome of left shoulder: Secondary | ICD-10-CM | POA: Insufficient documentation

## 2016-11-14 MED ORDER — NAPROXEN 500 MG PO TABS: 500.0000 mg | ORAL_TABLET | Freq: Two times a day (BID) | ORAL | 0 refills | Status: DC

## 2016-11-14 MED ORDER — RANITIDINE HCL 75 MG PO TABS: 75.0000 mg | ORAL_TABLET | Freq: Two times a day (BID) | ORAL | 0 refills | Status: DC

## 2016-11-14 NOTE — Patient Instructions (Signed)
Sean Park was seen today for numbness.  Diagnoses and all orders for this visit:  Gastroesophageal reflux disease without esophagitis  Impingement syndrome of left shoulder -     naproxen (NAPROSYN) 500 MG tablet; Take 1 tablet (500 mg total) by mouth 2 (two) times daily with a meal. -     ranitidine (ZANTAC 75) 75 MG tablet; Take 1 tablet (75 mg total) by mouth 2 (two) times daily.   F/u in 6 weeks for shoulder pains    Dr. Armen Pickup  Shoulder Impingement Syndrome Rehab Ask your health care provider which exercises are safe for you. Do exercises exactly as told by your health care provider and adjust them as directed. It is normal to feel mild stretching, pulling, tightness, or discomfort as you do these exercises, but you should stop right away if you feel sudden pain or your pain gets worse.Do not begin these exercises until told by your health care provider. Stretching and range of motion exercise This exercise warms up your muscles and joints and improves the movement and flexibility of your shoulder. This exercise also helps to relieve pain and stiffness. Exercise A: Passive horizontal adduction   1. Sit or stand and pull your left / right elbow across your chest, toward your other shoulder. Stop when you feel a gentle stretch in the back of your shoulder and upper arm.  Keep your arm at shoulder height.  Keep your arm as close to your body as you comfortably can. 2. Hold for __________ seconds. 3. Slowly return to the starting position. Repeat __________ times. Complete this exercise __________ times a day. Strengthening exercises These exercises build strength and endurance in your shoulder. Endurance is the ability to use your muscles for a long time, even after they get tired. Exercise B: External rotation, isometric  1. Stand or sit in a doorway, facing the door frame. 2. Bend your left / right elbow and place the back of your wrist against the door frame. Only your wrist  should be touching the frame. Keep your upper arm at your side. 3. Gently press your wrist against the door frame, as if you are trying to push your arm away from your abdomen.  Avoid shrugging your shoulder while you press your hand against the door frame. Keep your shoulder blade tucked down toward the middle of your back. 4. Hold for __________ seconds. 5. Slowly release the tension, and relax your muscles completely before you do the exercise again. Repeat __________ times. Complete this exercise __________ times a day. Exercise C: Internal rotation, isometric   1. Stand or sit in a doorway, facing the door frame. 2. Bend your left / right elbow and place the inside of your wrist against the door frame. Only your wrist should be touching the frame. Keep your upper arm at your side. 3. Gently press your wrist against the door frame, as if you are trying to push your arm toward your abdomen.  Avoid shrugging your shoulder while you press your hand against the door frame. Keep your shoulder blade tucked down toward the middle of your back. 4. Hold for __________ seconds. 5. Slowly release the tension, and relax your muscles completely before you do the exercise again. Repeat __________ times. Complete this exercise __________ times a day. Exercise D: Scapular protraction, supine   1. Lie on your back on a firm surface. Hold a __________ weight in your left / right hand. 2. Raise your left / right arm straight into the  air so your hand is directly above your shoulder joint. 3. Push the weight into the air so your shoulder lifts off of the surface that you are lying on. Do not move your head, neck, or back. 4. Hold for __________ seconds. 5. Slowly return to the starting position. Let your muscles relax completely before you repeat this exercise. Repeat __________ times. Complete this exercise __________ times a day. Exercise E: Scapular retraction   1. Sit in a stable chair without  armrests, or stand. 2. Secure an exercise band to a stable object in front of you so the band is at shoulder height. 3. Hold one end of the exercise band in each hand. Your palms should face down. 4. Squeeze your shoulder blades together and move your elbows slightly behind you. Do not shrug your shoulders while you do this. 5. Hold for __________ seconds. 6. Slowly return to the starting position. Repeat __________ times. Complete this exercise __________ times a day. Exercise F: Shoulder extension   1. Sit in a stable chair without armrests, or stand. 2. Secure an exercise band to a stable object in front of you where the band is above shoulder height. 3. Hold one end of the exercise band in each hand. 4. Straighten your elbows and lift your hands up to shoulder height. 5. Squeeze your shoulder blades together and pull your hands down to the sides of your thighs. Stop when your hands are straight down by your sides. Do not let your hands go behind your body. 6. Hold for __________ seconds. 7. Slowly return to the starting position. Repeat __________ times. Complete this exercise __________ times a day. This information is not intended to replace advice given to you by your health care provider. Make sure you discuss any questions you have with your health care provider. Document Released: 09/01/2005 Document Revised: 05/08/2016 Document Reviewed: 08/04/2015 Elsevier Interactive Patient Education  2017 [employer]

## 2016-11-14 NOTE — Progress Notes (Signed)
Pt is here today for left shoulder pain.

## 2016-11-14 NOTE — Progress Notes (Signed)
Subjective:  Patient ID: Sean Park, male    DOB: 01/24/1991  Age: 26 y.o. MRN: [id number]  CC: Numbness   HPI Sean MarlinJustin Stipes has hx of anxiety, GERD he  presents for   1.  Numbness in Left shoulder: numbness in L shoulder radiating down arm and into the chest. Comes and goes. Occurs at work and at rest. He works at a [employer]. No shortness of breath. There is associated weakness in his L hand. He has similar symptoms. he has been diagnosed with impingement syndrome in his left shoulder.  Nothing makes it better. It does go away over time. He sleeps 8 hours at night.    Social History  Substance Use Topics  . Smoking status: Former Smoker    Types: Cigarettes    Quit date: 09/15/2014  . Smokeless tobacco: Never Used  . Alcohol use No   Outpatient Medications Prior to Visit  Medication Sig Dispense Refill  . Multiple Vitamin (MULTIVITAMIN WITH MINERALS) TABS tablet Take 1 tablet by mouth daily.    . predniSONE (DELTASONE) 20 MG tablet Take 2 tablets (40 mg total) by mouth daily. (Patient not taking: Reported on 11/14/2016) 10 tablet 0   No facility-administered medications prior to visit.     ROS Review of Systems  Constitutional: Negative for chills, fatigue, fever and unexpected weight change.  Eyes: Negative for visual disturbance.  Respiratory: Negative for cough and shortness of breath.   Cardiovascular: Negative for chest pain, palpitations and leg swelling.  Gastrointestinal: Negative for abdominal pain, blood in stool, constipation, diarrhea, nausea and vomiting.  Endocrine: Negative for polydipsia, polyphagia and polyuria.  Musculoskeletal: Positive for arthralgias. Negative for back pain, gait problem, myalgias and neck pain.  Skin: Negative for rash.  Allergic/Immunologic: Negative for immunocompromised state.  Hematological: Negative for adenopathy. Does not bruise/bleed easily.  Psychiatric/Behavioral: Negative for dysphoric mood, sleep disturbance and suicidal  ideas. The patient is not nervous/anxious.     Objective:  BP 115/65 (BP Location: Left Arm, Patient Position: Sitting, Cuff Size: Small)   Pulse 68   Temp 98.3 F (36.8 C) (Oral)   Ht 5' 10.5" (1.791 m)   Wt 158 lb 9.6 oz (71.9 kg)   SpO2 97%   BMI 22.44 kg/m   BP/Weight 11/14/2016 01/03/2016 01/02/2016  Systolic BP 115 126 -  Diastolic BP 65 70 -  Wt. (Lbs) 158.6 - 168  BMI 22.44 23.44 -   Physical Exam  Constitutional: He appears well-developed and well-nourished. No distress.  HENT:  Head: Normocephalic and atraumatic.  Neck: Normal range of motion. Neck supple.  Cardiovascular: Normal rate, regular rhythm, normal heart sounds and intact distal pulses.   Pulmonary/Chest: Effort normal and breath sounds normal.  Musculoskeletal: He exhibits no edema.       Left shoulder: He exhibits tenderness and pain. He exhibits normal range of motion, no bony tenderness, no swelling, no effusion, no crepitus, no deformity, no laceration, no spasm, normal pulse and normal strength.       Arms: Neurological: He is alert.  Skin: Skin is warm and dry. No rash noted. No erythema.  Psychiatric: He has a normal mood and affect.     Assessment & Plan:  [name] was seen today for numbness.  Diagnoses and all orders for this visit:  Gastroesophageal reflux disease without esophagitis  Impingement syndrome of left shoulder -     naproxen (NAPROSYN) 500 MG tablet; Take 1 tablet (500 mg total) by mouth 2 (two) times daily with a  meal. -     ranitidine (ZANTAC 75) 75 MG tablet; Take 1 tablet (75 mg total) by mouth 2 (two) times daily.  There are no diagnoses linked to this encounter.  No orders of the defined types were placed in this encounter.   Follow-up: Return in about 6 weeks (around 12/26/2016) for shoulder pains .   Dessa Phi MD

## 2016-11-17 NOTE — Assessment & Plan Note (Signed)
Recurrent pain Plan: Naproxen, zantac Time off from work Home PT

## 2016-12-25 ENCOUNTER — Ambulatory Visit: Payer: Self-pay | Attending: Family Medicine | Admitting: Family Medicine

## 2016-12-25 ENCOUNTER — Encounter: Payer: Self-pay | Admitting: Family Medicine

## 2016-12-25 VITALS — BP 137/73 | HR 65 | Temp 98.3°F | Wt 159.0 lb

## 2016-12-25 DIAGNOSIS — Z87891 Personal history of nicotine dependence: Secondary | ICD-10-CM | POA: Insufficient documentation

## 2016-12-25 DIAGNOSIS — M79641 Pain in right hand: Secondary | ICD-10-CM | POA: Insufficient documentation

## 2016-12-25 DIAGNOSIS — Z79899 Other long term (current) drug therapy: Secondary | ICD-10-CM | POA: Insufficient documentation

## 2016-12-25 DIAGNOSIS — F419 Anxiety disorder, unspecified: Secondary | ICD-10-CM | POA: Insufficient documentation

## 2016-12-25 DIAGNOSIS — L739 Follicular disorder, unspecified: Secondary | ICD-10-CM | POA: Insufficient documentation

## 2016-12-25 DIAGNOSIS — M7542 Impingement syndrome of left shoulder: Secondary | ICD-10-CM | POA: Insufficient documentation

## 2016-12-25 DIAGNOSIS — K219 Gastro-esophageal reflux disease without esophagitis: Secondary | ICD-10-CM | POA: Insufficient documentation

## 2016-12-25 MED ORDER — NAPROXEN 500 MG PO TABS: 500.0000 mg | ORAL_TABLET | Freq: Two times a day (BID) | ORAL | 0 refills | Status: DC
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
## 2016-12-25 NOTE — Patient Instructions (Addendum)
Diagnoses and all orders for this visit:  Impingement syndrome of left shoulder -     Ambulatory referral to Sports Medicine -     naproxen (NAPROSYN) 500 MG tablet; Take 1 tablet (500 mg total) by mouth 2 (two) times daily with a meal.  Right hand pain -     Ambulatory referral to Sports Medicine -     DG Hand Complete Right; Future -     DG Wrist Complete Right; Future   Ice wrist for 20 minutes twice daily for swelling  Take naproxen Wear brace for next 6 weeks   Complete x-ray to rule out fracture in hand or wrist   f/u in 6 weeks for R hand pain and L shoulder pain  Dr. Armen Pickup    Folliculitis Folliculitis is inflammation of the hair follicles. Folliculitis most commonly occurs on the scalp, thighs, legs, back, and buttocks. However, it can occur anywhere on the body. What are the causes? This condition may be caused by:  A bacterial infection (common).  A fungal infection.  A viral infection.  Coming into contact with certain chemicals, especially oils and tars.  Shaving or waxing.  Applying greasy ointments or creams to your skin often. Long-lasting folliculitis and folliculitis that keeps coming back can be caused by bacteria that live in the nostrils. What increases the risk? This condition is more likely to develop in people with:  A weakened immune system.  Diabetes.  Obesity. What are the signs or symptoms? Symptoms of this condition include:  Redness.  Soreness.  Swelling.  Itching.  Small white or yellow, pus-filled, itchy spots (pustules) that appear over a reddened area. If there is an infection that goes deep into the follicle, these may develop into a boil (furuncle).  A group of closely packed boils (carbuncle). These tend to form in hairy, sweaty areas of the body. How is this diagnosed? This condition is diagnosed with a skin exam. To find what is causing the condition, your health care provider may take a sample of one of the  pustules or boils for testing. How is this treated? This condition may be treated by:  Applying warm compresses to the affected areas.  Taking an antibiotic medicine or applying an antibiotic medicine to the skin.  Applying or bathing with an antiseptic solution.  Taking an over-the-counter medicine to help with itching.  Having a procedure to drain any pustules or boils. This may be done if a pustule or boil contains a lot of pus or fluid.  Laser hair removal. This may be done to treat long-lasting folliculitis. Follow these instructions at home:  If directed, apply heat to the affected area as often as told by your health care provider. Use the heat source that your health care provider recommends, such as a moist heat pack or a heating pad.  Place a towel between your skin and the heat source.  Leave the heat on for 20-30 minutes.  Remove the heat if your skin turns bright red. This is especially important if you are unable to feel pain, heat, or cold. You may have a greater risk of getting burned.  If you were prescribed an antibiotic medicine, use it as told by your health care provider. Do not stop using the antibiotic even if you start to feel better.  Take over-the-counter and prescription medicines only as told by your health care provider.  Do not shave irritated skin.  Keep all follow-up visits as told by your health  care provider. This is important. Get help right away if:  You have more redness, swelling, or pain in the affected area.  Red streaks are spreading from the affected area.  You have a fever. This information is not intended to replace advice given to you by your health care provider. Make sure you discuss any questions you have with your health care provider. Document Released: 11/10/2001 Document Revised: 03/21/2016 Document Reviewed: 06/22/2015 Elsevier Interactive Patient Education  2017 [employer].

## 2016-12-25 NOTE — Assessment & Plan Note (Signed)
Persistent pain Additional time off ordered NSAID Sports medicine referral
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
## 2017-01-22 ENCOUNTER — Emergency Department (HOSPITAL_COMMUNITY): Payer: Self-pay

## 2017-01-22 ENCOUNTER — Encounter (HOSPITAL_COMMUNITY): Payer: Self-pay | Admitting: Emergency Medicine

## 2017-01-22 ENCOUNTER — Emergency Department (HOSPITAL_COMMUNITY)
Admission: EM | Admit: 2017-01-22 | Discharge: 2017-01-23 | Disposition: A | Discharge status: Home / Self Care | Payer: Self-pay | Attending: Emergency Medicine | Admitting: Emergency Medicine

## 2017-01-22 DIAGNOSIS — Y929 Unspecified place or not applicable: Secondary | ICD-10-CM | POA: Insufficient documentation

## 2017-01-22 DIAGNOSIS — Y9367 Activity, basketball: Secondary | ICD-10-CM | POA: Insufficient documentation

## 2017-01-22 DIAGNOSIS — M25551 Pain in right hip: Secondary | ICD-10-CM | POA: Insufficient documentation

## 2017-01-22 DIAGNOSIS — Z87891 Personal history of nicotine dependence: Secondary | ICD-10-CM | POA: Insufficient documentation

## 2017-01-22 DIAGNOSIS — Z79899 Other long term (current) drug therapy: Secondary | ICD-10-CM | POA: Insufficient documentation

## 2017-01-22 DIAGNOSIS — W1830XA Fall on same level, unspecified, initial encounter: Secondary | ICD-10-CM | POA: Insufficient documentation

## 2017-01-22 DIAGNOSIS — Y999 Unspecified external cause status: Secondary | ICD-10-CM | POA: Insufficient documentation

## 2017-01-22 NOTE — ED Notes (Signed)
Pt returned from xray

## 2017-01-22 NOTE — ED Triage Notes (Signed)
Pt st's he was playing basketball earlier today and fell.  Pt c/o pain to right hip.  St's very painful to bear wt.

## 2017-01-23 MED ORDER — HYDROCODONE-ACETAMINOPHEN 5-325 MG PO TABS
1.0000 | ORAL_TABLET | Freq: Once | ORAL | Status: AC
  Administered 2017-01-23: 1 via ORAL
  Filled 2017-01-23: qty 1

## 2017-01-23 MED ORDER — NAPROXEN 500 MG PO TABS: 500.0000 mg | ORAL_TABLET | Freq: Two times a day (BID) | ORAL | 0 refills | Status: DC

## 2017-01-23 MED ORDER — HYDROCODONE-ACETAMINOPHEN 5-325 MG PO TABS: 1.0000 | ORAL_TABLET | Freq: Four times a day (QID) | ORAL | 0 refills | Status: DC | PRN

## 2017-01-23 NOTE — ED Provider Notes (Signed)
[facility]-EMERGENCY DEPT Provider Note   CSN: [id number] Arrival date & time: 01/22/17  2253     History   Chief Complaint Chief Complaint  Patient presents with  . Hip Pain    HPI Sean Park is a 26 y.o. male.  Patient playing basketball earlier today, went up to block a shot, and tumbled over the other players head. Patient unable to right himself, landing on right hip.   The history is provided by the patient. No language interpreter was used.  Hip Pain  This is a new problem. The problem occurs constantly. The problem has not changed since onset.The symptoms are aggravated by standing. The symptoms are relieved by rest. He has tried rest for the symptoms. The treatment provided mild relief.    Past Medical History:  Diagnosis Date  . Anemia   . Anxiety   . Bronchitis   . Dysphagia   . GERD (gastroesophageal reflux disease)   . Muscle spasm   . Rotator cuff tear     Patient Active Problem List   Diagnosis Date Noted  . Right hand pain 12/25/2016  . Folliculitis 12/25/2016  . Impingement syndrome of left shoulder 04/13/2015  . Anxiety state 03/13/2015  . GERD (gastroesophageal reflux disease) 03/02/2015    History reviewed. No pertinent surgical history.     Home Medications    Prior to Admission medications   Medication Sig Start Date End Date Taking? Authorizing Provider  Multiple Vitamin (MULTIVITAMIN WITH MINERALS) TABS tablet Take 1 tablet by mouth daily.    [provider]  naproxen (NAPROSYN) 500 MG tablet Take 1 tablet (500 mg total) by mouth 2 (two) times daily with a meal. 12/25/16   Funches, Gerilyn Nestle, MD  ranitidine (ZANTAC 75) 75 MG tablet Take 1 tablet (75 mg total) by mouth 2 (two) times daily. 11/14/16   Dessa Phi, MD    Family History Family History  Problem Relation Age of Onset  . Diabetes Father   . Anxiety disorder Father   . Breast cancer Mother   . Crohn's disease Brother   . Colon cancer Neg Hx   . Esophageal  cancer Neg Hx   . Stomach cancer Neg Hx   . Rectal cancer Neg Hx     Social History Social History  Substance Use Topics  . Smoking status: Former Smoker    Types: Cigarettes    Quit date: 09/15/2014  . Smokeless tobacco: Never Used  . Alcohol use No     Allergies   Patient has no known allergies.   Review of Systems Review of Systems  Musculoskeletal: Positive for arthralgias.  All other systems reviewed and are negative.    Physical Exam Updated Vital Signs BP 121/73 (BP Location: Right Arm)   Pulse 70   Temp 98.1 F (36.7 C) (Oral)   Resp 17   Ht 5\' 10"  (1.778 m)   Wt 71.7 kg   SpO2 100%   BMI 22.67 kg/m   Physical Exam  Constitutional: He is oriented to person, place, and time. He appears well-developed and well-nourished.  HENT:  Head: Atraumatic.  Eyes: Conjunctivae are normal.  Neck: Normal range of motion. Neck supple.  Cardiovascular: Normal rate and regular rhythm.   Pulmonary/Chest: Effort normal and breath sounds normal.  Abdominal: Soft. There is no tenderness.  Musculoskeletal: He exhibits tenderness. He exhibits no edema or deformity.       Right hip: He exhibits decreased range of motion and tenderness. He exhibits no crepitus and  no deformity.       Legs: Decreased ROM due to pain. Distal pulses/sensation intact.  Neurological: He is alert and oriented to person, place, and time.  Skin: Skin is warm and dry.  Psychiatric: He has a normal mood and affect.     ED Treatments / Results  Labs (all labs ordered are listed, but only abnormal results are displayed) Labs Reviewed - No data to display  EKG  EKG Interpretation None       Radiology Dg Hip Unilat  With Pelvis 2-3 Views Right  Result Date: 01/23/2017 CLINICAL DATA:  26 y/o M; status post fall on right hip with 'pop' noise. EXAM: DG HIP (WITH OR WITHOUT PELVIS) 2-3V RIGHT COMPARISON:  None. FINDINGS: There is no evidence of hip fracture or dislocation. There is no evidence of  arthropathy or other focal bone abnormality. IMPRESSION: Negative. Electronically Signed   By: Mitzi HansenLance  Furusawa-Stratton M.D.   On: 01/23/2017 00:16    Procedures Procedures (including critical care time)  Medications Ordered in ED Medications - No data to display   Initial Impression / Assessment and Plan / ED Course  I have reviewed the triage vital signs and the nursing notes.  Pertinent labs & imaging results that were available during my care of the patient were reviewed by me and considered in my medical decision making (see chart for details).     Patient X-Ray negative for obvious fracture or dislocation.  Pt advised to follow up with orthopedics. Conservative therapy recommended and discussed. Patient will be discharged home & is agreeable with above plan. Returns precautions discussed. Pt appears safe for discharge.  Final Clinical Impressions(s) / ED Diagnoses   Final diagnoses:  Right hip pain    New Prescriptions New Prescriptions   HYDROCODONE-ACETAMINOPHEN (NORCO/VICODIN) 5-325 MG TABLET    Take 1 tablet by mouth every 6 (six) hours as needed for severe pain.   NAPROXEN (NAPROSYN) 500 MG TABLET    Take 1 tablet (500 mg total) by mouth 2 (two) times daily.     Felicie MornSmith, Davinia Riccardi, NP 01/23/17 [id number]    Azalia Bilisampos, Kevin, MD 01/23/17 [phone]

## 2017-01-28 ENCOUNTER — Encounter: Payer: Self-pay | Admitting: Family Medicine

## 2017-02-05 ENCOUNTER — Encounter: Payer: Self-pay | Admitting: Family Medicine

## 2017-02-05 ENCOUNTER — Ambulatory Visit: Payer: Self-pay | Attending: Family Medicine | Admitting: Family Medicine

## 2017-02-05 ENCOUNTER — Ambulatory Visit (HOSPITAL_BASED_OUTPATIENT_CLINIC_OR_DEPARTMENT_OTHER): Payer: Self-pay | Admitting: Licensed Clinical Social Worker

## 2017-02-05 VITALS — BP 99/60 | HR 96 | Temp 98.3°F | Wt 158.0 lb

## 2017-02-05 DIAGNOSIS — Z87891 Personal history of nicotine dependence: Secondary | ICD-10-CM | POA: Insufficient documentation

## 2017-02-05 DIAGNOSIS — M79641 Pain in right hand: Secondary | ICD-10-CM | POA: Insufficient documentation

## 2017-02-05 DIAGNOSIS — F329 Major depressive disorder, single episode, unspecified: Secondary | ICD-10-CM | POA: Insufficient documentation

## 2017-02-05 DIAGNOSIS — M7542 Impingement syndrome of left shoulder: Secondary | ICD-10-CM | POA: Insufficient documentation

## 2017-02-05 DIAGNOSIS — F419 Anxiety disorder, unspecified: Secondary | ICD-10-CM | POA: Insufficient documentation

## 2017-02-05 DIAGNOSIS — L301 Dyshidrosis [pompholyx]: Secondary | ICD-10-CM | POA: Insufficient documentation

## 2017-02-05 DIAGNOSIS — K219 Gastro-esophageal reflux disease without esophagitis: Secondary | ICD-10-CM | POA: Insufficient documentation

## 2017-02-05 MED ORDER — FLUOCINONIDE 0.05 % EX OINT: 1.0000 "application " | TOPICAL_OINTMENT | Freq: Two times a day (BID) | CUTANEOUS | 0 refills | Status: DC

## 2017-02-05 MED ORDER — ESCITALOPRAM OXALATE 10 MG PO TABS: 10.0000 mg | ORAL_TABLET | Freq: Every day | ORAL | 2 refills | Status: DC

## 2017-02-05 MED FILL — FLUOCINONIDE 0.05% OINTMENT: 0.05 | 30 days supply | Qty: 60 | Fill #0

## 2017-02-05 MED FILL — ?ESCITALOPRAM 10 MG TABLET: 10 | 30 days supply | Qty: 30 | Fill #0

## 2017-02-05 NOTE — Assessment & Plan Note (Signed)
A; anxiety and depression with suicidal thoughts P: lexapro Internal referral to CSW assessment completed Psychology referral

## 2017-02-05 NOTE — Assessment & Plan Note (Addendum)
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

## 2017-02-05 NOTE — Patient Instructions (Addendum)
Sean AlexandersJustin was seen today for follow-up.  Diagnoses and all orders for this visit:  Anxiety and depression -     escitalopram (LEXAPRO) 10 MG tablet; Take 1 tablet (10 mg total) by mouth daily. -     Ambulatory referral to Psychology  Dyshidrotic eczema -     fluocinonide ointment (LIDEX) 0.05 %; Apply 1 application topically 2 (two) times daily. To rash on palms    F/u in 4 weeks for anxiety and depression symptoms  Dr. Armen PickupFunches    Hand Dermatitis Hand dermatitis is a skin condition. It causes small, itchy, raised dots or fluid-filled blisters to form on the palms of the hands. This condition may also be called hand eczema. Follow these instructions at home:  Take or apply over-the-counter and prescription medicines only as told by your doctor.  If you were prescribed an antibiotic medicine, use it as told by your doctor. Do not stop using the antibiotic even if you start to feel better.  Avoid washing your hands more often than you need to.  Avoid using harsh chemicals on your hands.  Wear gloves that protect your hands when you handle products that can bother (irritate) your skin.  Keep all follow-up visits as told by your doctor. This is important. Contact a doctor if:  Your rash is not better after one week of treatment.  Your rash is red.  Your rash is tender.  Your rash has pus coming from it.  Your rash spreads. This information is not intended to replace advice given to you by your health care provider. Make sure you discuss any questions you have with your health care provider. Document Released: 11/26/2009 Document Revised: 02/07/2016 Document Reviewed: 03/16/2015 Elsevier Interactive Patient Education  2017 [employer]

## 2017-02-05 NOTE — BH Specialist Note (Signed)
Integrated Behavioral Health Initial Visit  MRN: [id number] Name: Sean MarlinJustin Venning   Session Start time: 10:30 AM Session End time: 11:00 AM Total time: 30 minutes  Type of Service: Integrated Behavioral Health- Individual/Family Interpretor:No. Interpretor Name and Language: N/A   Warm Hand Off Completed.       SUBJECTIVE: Sean Park is a 26 y.o. male accompanied by girlfriend. Patient was referred by Dr. Armen PickupFunches for depression and anxiety. Patient reports the following symptoms/concerns: overwhelming feelings of sadness and worry, difficulty staying asleep, irritability, chronic pain, and suicidal ideations Duration of problem: One month; Severity of problem: severe  OBJECTIVE: Mood: Anxious and Depressed and Affect: Depressed Risk of harm to self or others: Suicidal ideation No plan to harm self or others   LIFE CONTEXT: Family and Social: Pt receives support from girlfriend. Father has terminal brain cancer and mother is in remission. Pt's brother is dependent on substances and needs financial assistance often School/Work: Pt is employed as a Chemical engineermanager Self-Care: Pt enjoys walking, listening to music, and praying. He reports smoking marijuana twice a day to assist in stabilizing mood and sleeping Life Changes: Pt is experiencing high stress from providing for older sibling, employment, and both parents having ongoing medical concerns  GOALS ADDRESSED: Patient will reduce symptoms of: agitation, anxiety and depression and increase knowledge and/or ability of: coping skills and healthy habits and also: Increase adequate support systems for patient/family and Decrease self-medicating behaviors   INTERVENTIONS: Solution-Focused Strategies, Supportive Counseling, Psychoeducation and/or Health Education and Link to [facility]  Standardized Assessments completed: PHQ 2&9 with C-SSRS  ASSESSMENT: Patient currently experiencing depression and anxiety triggered by chronic  pain and stress about medical conditions of parents and sibling. He reports overwhelming feelings of sadness and worry, difficulty staying asleep, irritability, chronic pain, and suicidal ideations. Patient currently denies SI/HI/AVH. Patient receives support from girlfriend who was present during assessment. Patient may benefit from psychoeducation, psychotherapy, and medication management. LCSWA educated pt on the cycle of depression and anxiety and discussed how substance use can negatively impact pt's mental and physical health. LCSWA inquired about protective factors and assisted pt with a safety plan. LCSWA discussed benefits of applying healthy coping skills to decrease symptoms. Pt was successful in identifying healthy strategies to utilize on a daily basis. Pt is open to medication management through PCP and was provided community resources for crisis intervention and psychotherapy.   PLAN: 1. Follow up with behavioral health clinician on : Pt was encouraged to contact LCSWA if symptoms worsen or fail to improve to [facility]. 2. Behavioral recommendations: LCSWA recommends that pt apply healthy coping skills discussed, comply with prescribed medication, and utilize provided resources. Pt is encouraged to schedule follow up appointment with LCSWA 3. Referral(s): Community Mental Health Services (LME/Outside Clinic) 4. "From scale of 1-10, how likely are you to follow plan?": 7/10  Bridgett LarssonJasmine D Lewis, LCSW 02/05/17 3:25 PM

## 2017-02-05 NOTE — Assessment & Plan Note (Signed)
Pain following hitting the wall has improved No deformity Patient has not completed x-ray of wrist and hand but would like to X-rays were ordered at last visit

## 2017-02-05 NOTE — Progress Notes (Signed)
Subjective:  Patient ID: Sean Park, male    DOB: 06/02/1991  Age: 26 y.o. MRN: [id number]  CC: Follow-up   HPI Sean Park has hx of anxiety, GERD he  Presents with his girlfriend  for   1.  Numbness in Left shoulder: This is a chronic pain that is usually exacerbated by work. 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 He is mostly working the Ambulance personcash register. He still has some numbness and pain in L shoulder and L chest.  Comes and goes for past 2 years. Level of pain is up to 6/10. No pain currently.  Last felt pain this morning after getting up out of bed.   2. Anxiety and depression: he has anxiety and depression symptoms that wax and wane. He reports feeling depressed currently. Thoughts of being better off dead. Last thought this way 2 days ago. No plan. Triggers are dad with terminal brain cancer. He feels his dad is "giving up". His dad has elected to no longer undergo treatment. Older brother with cocaine abuse and asking for money. Stress at work. Stress in relationship with his girlfriend. He has tried prozac in the past. Also lexapro for 1 week only. He is amenable to medication and therapy.   3. ED f/u R hip pain: he fell onto his R hip on 01/22/17 while playing basketball. He tumbled over another player. [facility] He was noted to have tenderness over R lateral hip. R hip x-ray was negative for fracture or dislocation. Today he reports pain is 4/10. He still has tenderness in R lateral hip. Pain is exacerbated by dancing.  He has been diagnosed with impingement syndrome in his left shoulder.    4. R hand pain: started in 11/2016 after he punched a wall while drunk. Pain in thenar eminence and a bit in radial wrist. Pain has improved. No longer tender. He has not completed ordered x-rays.   5. Rash on palms: x 2 months worsening. No pain,  itching or burning. Spreading upward toward finger tips. Hands are not getting wet  currently. He has used interior wax which seemed to worsen the rash. He denies rash on any other area of his body.   Social History  Substance Use Topics  . Smoking status: Former Smoker    Types: Cigarettes    Quit date: 09/15/2014  . Smokeless tobacco: Never Used  . Alcohol use No   Outpatient Medications Prior to Visit  Medication Sig Dispense Refill  . HYDROcodone-acetaminophen (NORCO/VICODIN) 5-325 MG tablet Take 1 tablet by mouth every 6 (six) hours as needed for severe pain. 10 tablet 0  . Multiple Vitamin (MULTIVITAMIN WITH MINERALS) TABS tablet Take 1 tablet by mouth daily.    . naproxen (NAPROSYN) 500 MG tablet Take 1 tablet (500 mg total) by mouth 2 (two) times daily. 20 tablet 0  . ranitidine (ZANTAC 75) 75 MG tablet Take 1 tablet (75 mg total) by mouth 2 (two) times daily. 30 tablet 0   No facility-administered medications prior to visit.     ROS Review of Systems  Constitutional: Negative for chills, fatigue, fever and unexpected weight change.  Eyes: Negative for visual disturbance.  Respiratory: Negative for cough and shortness of breath.   Cardiovascular: Negative for chest pain, palpitations and leg swelling.  Gastrointestinal: Negative for abdominal pain, blood in stool, constipation, diarrhea, nausea and vomiting.  Endocrine: Negative for polydipsia, polyphagia and polyuria.  Musculoskeletal: Positive for arthralgias. Negative for back pain, gait problem, myalgias and neck pain.  Skin: Positive for rash.  Allergic/Immunologic: Negative for immunocompromised state.  Neurological: Positive for numbness (in anterior neck, L chest and L shoulder).  Hematological: Negative for adenopathy. Does not bruise/bleed easily.  Psychiatric/Behavioral: Positive for dysphoric mood. Negative for sleep disturbance and suicidal ideas. The patient is nervous/anxious.     Objective:  BP 99/60   Pulse 96   Temp 98.3 F (36.8 C) (Oral)   Wt 158 lb (71.7 kg)   SpO2 96%   BMI  22.67 kg/m   BP/Weight 02/05/2017 01/23/2017 01/22/2017  Systolic BP 99 124 -  Diastolic BP 60 54 -  Wt. (Lbs) 158 - 158  BMI 22.67 - 22.67   Physical Exam  Constitutional: He appears well-developed and well-nourished. No distress.  HENT:  Head: Normocephalic and atraumatic.  Neck: Normal range of motion. Neck supple.  Cardiovascular: Normal rate, regular rhythm, normal heart sounds and intact distal pulses.   Pulmonary/Chest: Effort normal and breath sounds normal.  Musculoskeletal: He exhibits no edema.       Left shoulder: He exhibits tenderness and pain. He exhibits normal range of motion, no bony tenderness, no swelling, no effusion, no crepitus, no deformity, no laceration, no spasm, normal pulse and normal strength.       Right hip: He exhibits tenderness (lateral hip). He exhibits normal range of motion, normal strength, no bony tenderness, no swelling, no crepitus, no deformity and no laceration.       Arms:      Hands: Neurological: He is alert.  Skin: Skin is warm and dry. No rash noted. No erythema.     Psychiatric: He has a normal mood and affect.   Cleaned R palm with alcohol and iodine Unroofed one vesicle on R palm clear fluid released Fluid cultured  Assessment & Plan:  Kavin was seen today for follow-up.  Diagnoses and all orders for this visit:  Anxiety and depression -     escitalopram (LEXAPRO) 10 MG tablet; Take 1 tablet (10 mg total) by mouth daily. -     Ambulatory referral to Psychology  Dyshidrotic eczema -     fluocinonide ointment (LIDEX) 0.05 %; Apply 1 application topically 2 (two) times daily. To rash on palms -     Fungus culture w smear  Right hand pain  There are no diagnoses linked to this encounter.  No orders of the defined types were placed in this encounter.   Follow-up: Return in about 4 weeks (around 03/05/2017) for anxiety and depression.   Dessa Phi MD

## 2017-02-11 ENCOUNTER — Telehealth: Payer: Self-pay

## 2017-02-11 NOTE — Telephone Encounter (Signed)
Pt was called and informed of culture results.

## 2017-02-14 IMAGING — CR DG CHEST 2V
2 series · 2 of 2 positions shown · non-contrast
Comparison: February 19, 2015

CLINICAL DATA: Three-week history of chest pain and intermittent
shortness of breath

EXAM:
CHEST  2 VIEW

[chest pa]
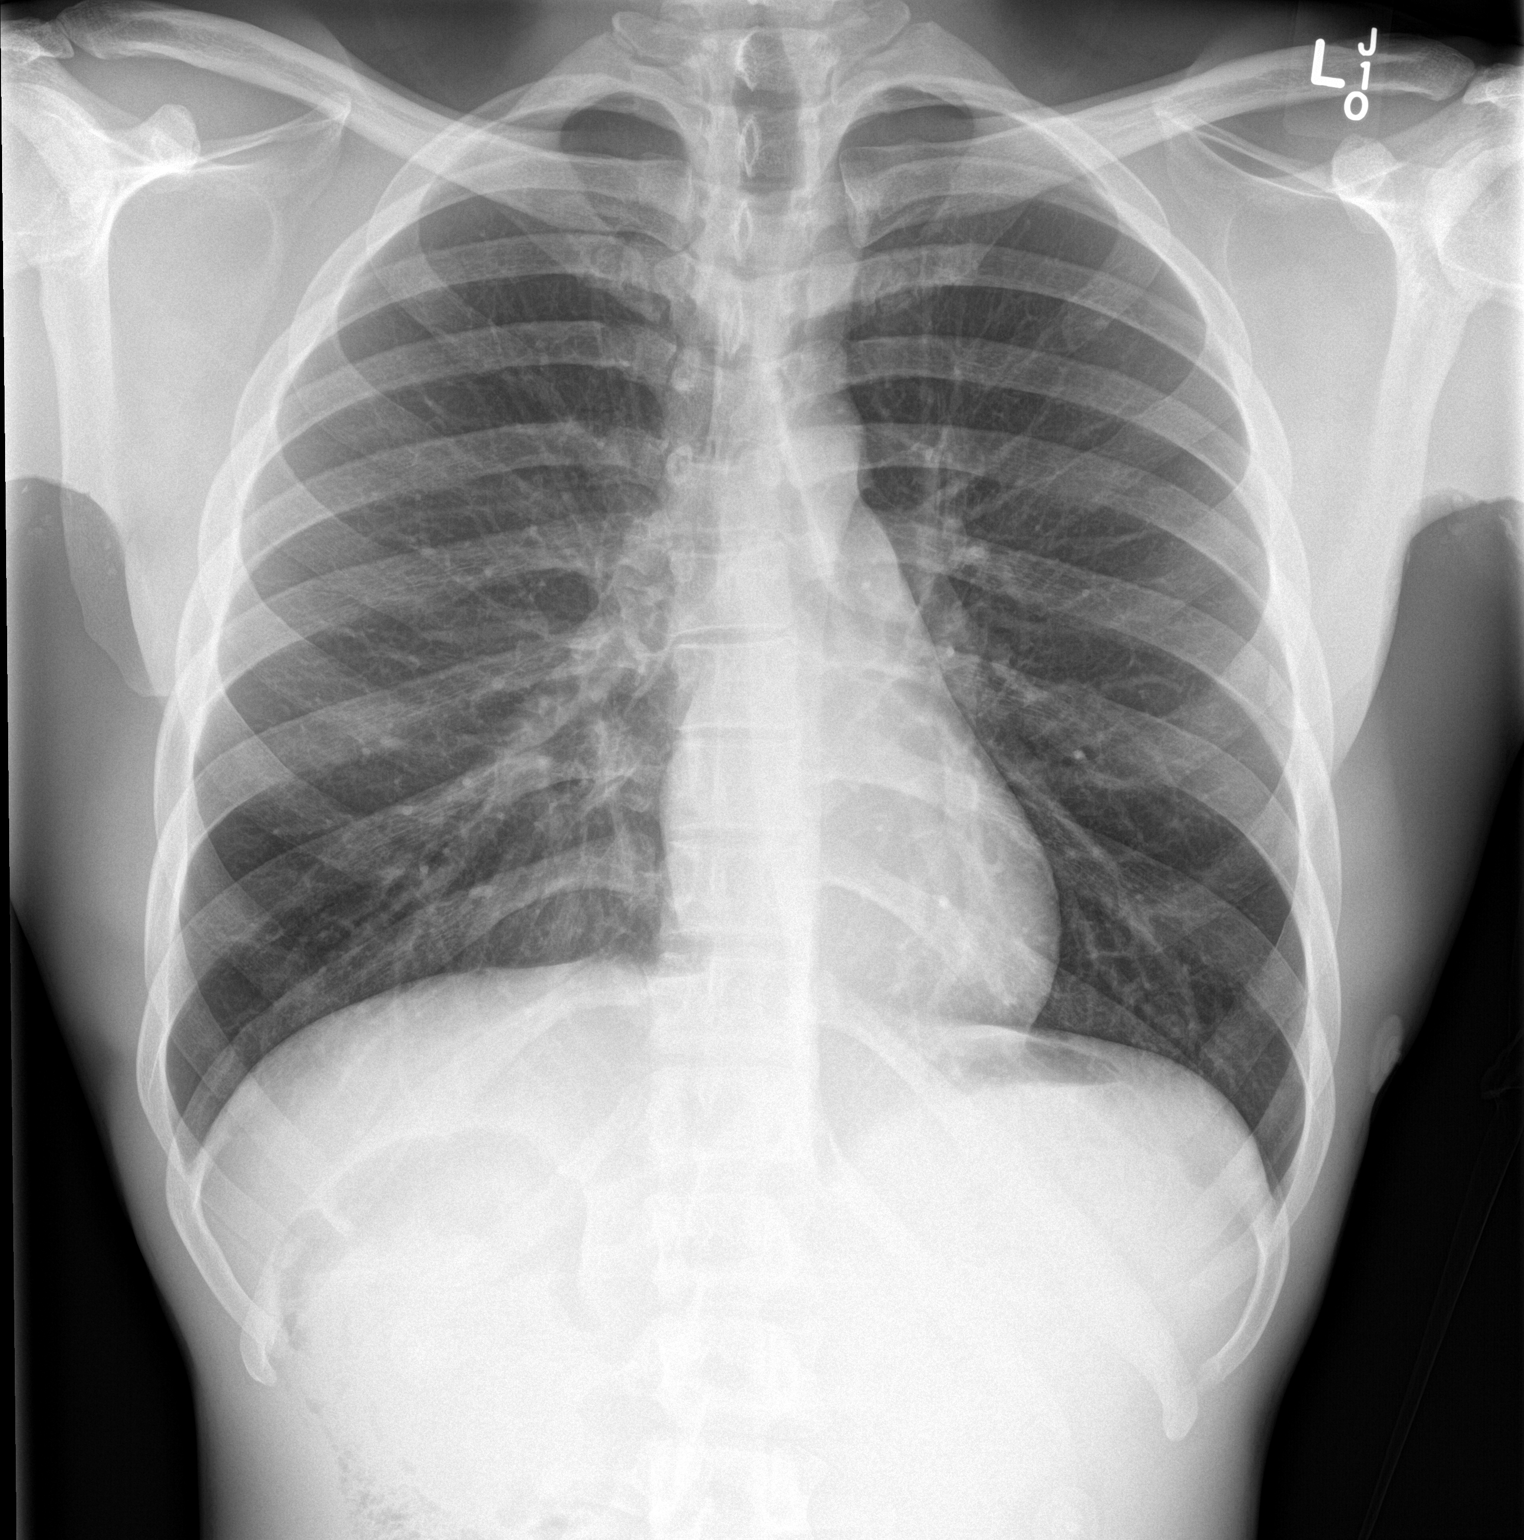

[chest lat]
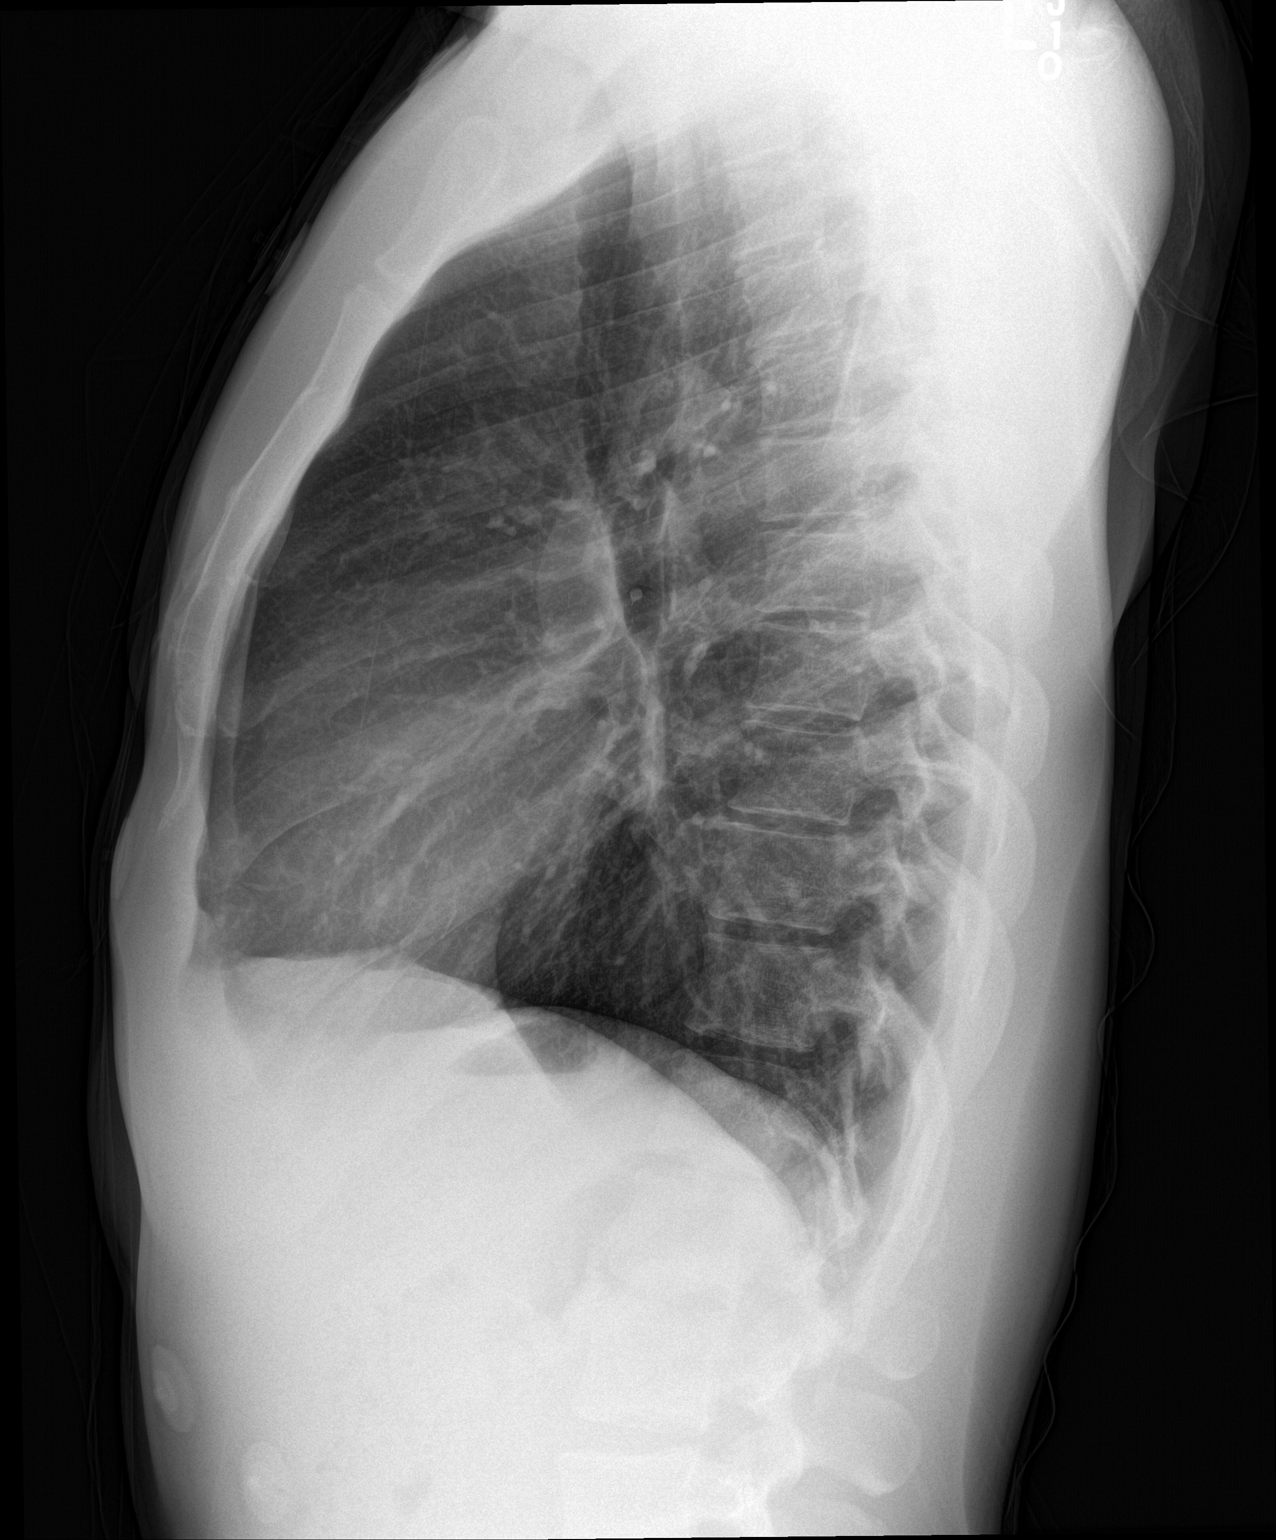

[2 of 2 positions shown; findings below may reference images not displayed]

FINDINGS: There is no edema or consolidation. Heart size and pulmonary
vascularity are normal. No adenopathy. No bone lesions.
IMPRESSION: No edema or consolidation.

## 2017-03-05 LAB — FUNGUS CULTURE W SMEAR

## 2017-03-13 ENCOUNTER — Encounter: Payer: Self-pay | Admitting: Family Medicine

## 2017-03-13 ENCOUNTER — Ambulatory Visit: Payer: Self-pay | Attending: Family Medicine | Admitting: Family Medicine

## 2017-03-13 VITALS — BP 100/58 | HR 76 | Temp 99.0°F | Wt 157.0 lb

## 2017-03-13 DIAGNOSIS — Z87891 Personal history of nicotine dependence: Secondary | ICD-10-CM | POA: Insufficient documentation

## 2017-03-13 DIAGNOSIS — Z79899 Other long term (current) drug therapy: Secondary | ICD-10-CM | POA: Insufficient documentation

## 2017-03-13 DIAGNOSIS — F329 Major depressive disorder, single episode, unspecified: Secondary | ICD-10-CM | POA: Insufficient documentation

## 2017-03-13 DIAGNOSIS — K219 Gastro-esophageal reflux disease without esophagitis: Secondary | ICD-10-CM | POA: Insufficient documentation

## 2017-03-13 DIAGNOSIS — F419 Anxiety disorder, unspecified: Secondary | ICD-10-CM | POA: Insufficient documentation

## 2017-03-13 DIAGNOSIS — M7542 Impingement syndrome of left shoulder: Secondary | ICD-10-CM | POA: Insufficient documentation

## 2017-03-13 DIAGNOSIS — L301 Dyshidrosis [pompholyx]: Secondary | ICD-10-CM

## 2017-03-13 MED ORDER — FLUOCINONIDE 0.05 % EX OINT: 1.0000 "application " | TOPICAL_OINTMENT | Freq: Two times a day (BID) | CUTANEOUS | 2 refills | Status: DC

## 2017-03-13 MED ORDER — ESCITALOPRAM OXALATE 10 MG PO TABS: ORAL_TABLET | ORAL | 0 refills | Status: DC

## 2017-03-13 MED ORDER — ESCITALOPRAM OXALATE 10 MG PO TABS: 10.0000 mg | ORAL_TABLET | Freq: Every day | ORAL | 2 refills | Status: DC

## 2017-03-13 NOTE — Assessment & Plan Note (Addendum)
Improving Continue lidex

## 2017-03-13 NOTE — Patient Instructions (Addendum)
Sean Park was seen today for depression.  Diagnoses and all orders for this visit:  Anxiety and depression -     Discontinue: escitalopram (LEXAPRO) 10 MG tablet; Take 1 tablet (10 mg total) by mouth daily. -     escitalopram (LEXAPRO) 10 MG tablet; Take one tablet by mouth for 14 days then increase to two tablets daily -     Ambulatory referral to Psychology  Impingement syndrome of left shoulder -     Ambulatory referral to Sports Medicine   F/u in 4 weeks for anxiety and depression   Dr. Armen PickupFunches

## 2017-03-13 NOTE — Progress Notes (Signed)
Subjective:  Patient ID: Sean Park, male    DOB: [birth year]  Age: 26 y.o. MRN: [id number]  CC: Depression   HPI Elihue Ebert has hx of anxiety, GERD he  Presents with his girlfriend  for   1.  Numbness in Left shoulder: This is a chronic pain that is usually exacerbated by work. 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  He reports increased pain in his R shoulder, R upper back and upper chest with numbness that radiates down his arm to his index finger.   2. Anxiety and depression: he has anxiety and depression symptoms that wax and wane. He reports feeling depressed currently. Thoughts of being better off dead.  No plan. Triggers are dad with terminal brain cancer. He feels his dad is "giving up". His dad has elected to no longer undergo treatment. Older brother with cocaine abuse and asking for money. Mother with history of breast cancer recent new of lesion in liver awaiting biopsy results. Stress at work. Stress in relationship with his girlfriend. He has tried prozac in the past. He restarted lexapro after his last visit but took only 10 days worth due to no change in symptoms. He denies dizziness, anxiety, sexual dysfunction.   3. Dermatitis: on hands. Palms. Fungal culture negative. Improving with lidex ointment.   Social History  Substance Use Topics  . Smoking status: Former Smoker    Types: Cigarettes    Quit date: 09/15/2014  . Smokeless tobacco: Never Used  . Alcohol use No   Outpatient Medications Prior to Visit  Medication Sig Dispense Refill  . fluocinonide ointment (LIDEX) 0.05 % Apply 1 application topically 2 (two) times daily. To rash on palms 60 g 0  . Multiple Vitamin (MULTIVITAMIN WITH MINERALS) TABS tablet Take 1 tablet by mouth daily.    Marland Kitchen escitalopram (LEXAPRO) 10 MG tablet Take 1 tablet (10 mg total) by mouth  daily. (Patient not taking: Reported on 03/13/2017) 30 tablet 2   No facility-administered medications prior to visit.     ROS Review of Systems  Constitutional: Negative for chills, fatigue, fever and unexpected weight change.  Eyes: Negative for visual disturbance.  Respiratory: Negative for cough and shortness of breath.   Cardiovascular: Negative for chest pain, palpitations and leg swelling.  Gastrointestinal: Negative for abdominal pain, blood in stool, constipation, diarrhea, nausea and vomiting.  Endocrine: Negative for polydipsia, polyphagia and polyuria.  Musculoskeletal: Positive for arthralgias. Negative for back pain, gait problem, myalgias and neck pain.  Skin: Positive for rash.  Allergic/Immunologic: Negative for immunocompromised state.  Neurological: Positive for numbness (in anterior neck, L chest and L shoulder).  Hematological: Negative for adenopathy. Does not bruise/bleed easily.  Psychiatric/Behavioral: Positive for dysphoric mood. Negative for sleep disturbance and suicidal ideas. The patient is nervous/anxious.     Objective:  BP (!) 100/58   Pulse 76   Temp 99 F (37.2 C) (Oral)   Wt 157 lb (71.2 kg)   SpO2 100%   BMI 22.53 kg/m   BP/Weight 03/13/2017 02/05/2017 01/23/2017  Systolic BP 100 99 124  Diastolic BP 58 60 54  Wt. (Lbs) 157 158 -  BMI 22.53 22.67 -   Physical Exam  Constitutional: He appears well-developed and well-nourished. No distress.  HENT:  Head: Normocephalic and atraumatic.  Neck: Normal range of motion.  Neck supple.  Cardiovascular: Normal rate, regular rhythm, normal heart sounds and intact distal pulses.   Pulmonary/Chest: Effort normal and breath sounds normal.  Musculoskeletal: He exhibits no edema.       Left shoulder: He exhibits tenderness and pain. He exhibits normal range of motion, no bony tenderness, no swelling, no effusion, no crepitus, no deformity, no laceration, no spasm, normal pulse and normal strength.        Hands: Neurological: He is alert.  Skin: Skin is warm and dry. No rash noted. No erythema.     Psychiatric: He exhibits a depressed mood.   Cleaned R palm with alcohol and iodine Unroofed one vesicle on R palm clear fluid released   Assessment & Plan:  [name] was seen today for depression.  Diagnoses and all orders for this visit:  Anxiety and depression -     Discontinue: escitalopram (LEXAPRO) 10 MG tablet; Take 1 tablet (10 mg total) by mouth daily. -     escitalopram (LEXAPRO) 10 MG tablet; Take one tablet by mouth for 14 days then increase to two tablets daily -     Ambulatory referral to Psychology  Impingement syndrome of left shoulder -     Ambulatory referral to Sports Medicine  There are no diagnoses linked to this encounter.  Meds ordered this encounter  Medications  . DISCONTD: escitalopram (LEXAPRO) 10 MG tablet    Sig: Take 1 tablet (10 mg total) by mouth daily.    Dispense:  30 tablet    Refill:  2  . escitalopram (LEXAPRO) 10 MG tablet    Sig: Take one tablet by mouth for 14 days then increase to two tablets daily    Dispense:  42 tablet    Refill:  0  . fluocinonide ointment (LIDEX) 0.05 %    Sig: Apply 1 application topically 2 (two) times daily. To rash on palms    Dispense:  60 g    Refill:  2    Follow-up: Return in about 4 weeks (around 04/10/2017) for depression.   Dessa PhiJosalyn Amel Kitch MD

## 2017-03-13 NOTE — Assessment & Plan Note (Signed)
Referral back to sports medicine Letter recommending light duty provided

## 2017-03-13 NOTE — Assessment & Plan Note (Signed)
Persistent Restart lexapro 10 mg daily for 2 weeks, then 20 mg daily

## 2017-04-01 ENCOUNTER — Encounter: Payer: Self-pay | Admitting: Family Medicine

## 2017-04-01 ENCOUNTER — Ambulatory Visit (INDEPENDENT_AMBULATORY_CARE_PROVIDER_SITE_OTHER): Payer: Self-pay | Admitting: Family Medicine

## 2017-04-01 DIAGNOSIS — G2589 Other specified extrapyramidal and movement disorders: Secondary | ICD-10-CM | POA: Insufficient documentation

## 2017-04-01 DIAGNOSIS — M7582 Other shoulder lesions, left shoulder: Secondary | ICD-10-CM | POA: Insufficient documentation

## 2017-04-01 MED ORDER — MELOXICAM 15 MG PO TABS: 15.0000 mg | ORAL_TABLET | Freq: Every day | ORAL | 0 refills | Status: DC

## 2017-04-01 NOTE — Assessment & Plan Note (Signed)
Patient is here with signs and symptoms consistent with rotator cuff tendinitis. No weakness or numbness noted. No acute injury. Patient likely has aggravated this tissue with repetitive movements during work. Left-sided scapular winging noted on exam as well. It is likely that this scapular dyskinesia is causing some impingement and irritation to the rotator cuff. It is also likely that due to patient's muscle mass he has obtained some imbalance of strength with relative weakness in his upper back compared to his chest (leading to the scapular dyskinesia). - We discussed at length patient's available options. Patient would like to try conservative treatment at this time. - Home exercise program for rotator cuff and scapular strengthening. - Short course of Mobic provided (discussed warning signs and cautions due to history of peptic ulcer disease) - RICE therapy encouraged. - Patient to follow-up in 4-6 weeks to reassess. Patient was informed that if symptoms had resolved by that time he can call and cancel this appointment.  Next: If symptoms persist or worsen consider subacromial injection.

## 2017-04-01 NOTE — Assessment & Plan Note (Signed)
See plan above.

## 2017-04-01 NOTE — Patient Instructions (Signed)
It was a pleasure seeing you today in our clinic. Today we discussed your shoulder pain. This injury and the pain involved may take a long time to heal/resolve, so be patient. Here is the treatment plan I would like for you to follow:  For most musculoskeletal pain we physicians tend to recommend the "R.I.C.E." method of therapy. This stands for "Rest", "Ice", "Compression", and "Elevation". - Rest: I would like for you to take it easy for a period of time. This may be a period of days or weeks, everyone is different. Allow pain to be your guide. Achiness and soreness is to be expected, but true pain is to be avoided. - Ice: Using an ice pack 2-4 times a day for ~20 minutes each time can help with pain, swelling, and healing. - Compression: Using a compression sleeve or ACE Bandage (both available at most pharmacies) can help with swelling and pain. Having these on through the day is ideal, but do not sleep with these in place. - Elevation: It is not always possible to elevate an injury due to it's location on your body. However, whenever possible it would be beneficial to elevate musculoskeletal injuries above the level of the heart. (Example: propping an injured ankle up on pillows at night while asleep)   Meloxicam and Tylenol: Begin taking the meloxicam once a day (scheduled) for 5 days. After those 5 days only take this meloxicam as needed once a day. For particularly bad days you can use Tylenol for additional relief. This can be taken 3-4 times a day.  Dosing: - Meloxicam 15 mg once a day. - 650-1000mg  of Tylenol 3 times a day (max dose).

## 2017-04-01 NOTE — Progress Notes (Signed)
   HPI  CC: left shoulder About 6 months. Persistent. Occurs while working, works at a car [employer]. Occasionally feels like the "bone is rubbing"  Traumatic: no (Yes? MOI)  Location: lateral and anterior shoulder  Quality: aching  Duration: persistent. 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   Objective: BP 110/70   Ht 5\' 11"  (1.803 m)   Wt 156 lb (70.8 kg)   BMI 21.76 kg/m  Gen: Right-Hand Dominant. NAD, well groomed, a/o x3, normal affect.  CV: Well-perfused. Warm.  Resp: Non-labored.  Neuro: Sensation intact throughout. No gross coordination deficits.  Gait: Nonpathologic posture, unremarkable stride without signs of limp or balance issues. Shoulder, Left: No evidence of bony deformity, asymmetry, or muscle atrophy; Mild tenderness over long head of biceps (bicipital groove). [facility]. TTP noted at the posterior lateral aspect of the shoulder and ipsilateral inferior rhomboid. Full active and passive range of motion (180 flex Elisabeth Most/60Ext /150Abd /90ER /70IR), Thumb to T12 without significant tenderness. Strength 5/5 throughout. Abnormal scapular function observed (specifically with forward flexion). Sensation intact. Peripheral pulses intact.  Special Tests:   - Crossarm test: NEG for AC pain; POS for posterior shoulder pain   - Empty can: Positive   - Neer test: Positive   - Obrien's test: Positive, with significant improvement thumb up   - Yergason's: NEG   - Speeds test: NEG   Assessment and  plan:  Rotator cuff tendinitis, left Patient is here with signs and symptoms consistent with rotator cuff tendinitis. No weakness or numbness noted. No acute injury. Patient likely has aggravated this tissue with repetitive movements during work. Left-sided scapular winging noted on exam as well. It is likely that this scapular dyskinesia is causing some impingement and irritation to the rotator cuff. It is also likely that due to patient's muscle mass he has obtained some imbalance of strength with relative weakness in his upper back compared to his chest (leading to the scapular dyskinesia). - We discussed at length patient's available options. Patient would like to try conservative treatment at this time. - Home exercise program for rotator cuff and scapular strengthening. - Short course of Mobic provided (discussed warning signs and cautions due to history of peptic ulcer disease) - RICE therapy encouraged. - Patient to follow-up in 4-6 weeks to reassess. Patient was informed that if symptoms had resolved by that time he can call and cancel this appointment.  Next: If symptoms persist or worsen consider subacromial injection.  Scapular dyskinesis See plan above   Meds ordered this encounter  Medications  . meloxicam (MOBIC) 15 MG tablet    Sig: Take 1 tablet (15 mg total) by mouth daily. For 5 days, then daily AS NEEDED after that.    Dispense:  15 tablet    Refill:  0     Kathee DeltonIan D Jaylea Plourde, MD,MS [facility] 04/01/2017 5:40 PM

## 2017-04-10 ENCOUNTER — Ambulatory Visit: Payer: Self-pay | Attending: Family Medicine | Admitting: Family Medicine

## 2017-04-10 ENCOUNTER — Encounter: Payer: Self-pay | Admitting: Family Medicine

## 2017-04-10 ENCOUNTER — Ambulatory Visit: Payer: Self-pay | Admitting: Licensed Clinical Social Worker

## 2017-04-10 DIAGNOSIS — Z87891 Personal history of nicotine dependence: Secondary | ICD-10-CM | POA: Insufficient documentation

## 2017-04-10 DIAGNOSIS — F419 Anxiety disorder, unspecified: Secondary | ICD-10-CM | POA: Insufficient documentation

## 2017-04-10 DIAGNOSIS — F4321 Adjustment disorder with depressed mood: Secondary | ICD-10-CM

## 2017-04-10 DIAGNOSIS — F32A Depression, unspecified: Secondary | ICD-10-CM

## 2017-04-10 DIAGNOSIS — Z803 Family history of malignant neoplasm of breast: Secondary | ICD-10-CM | POA: Insufficient documentation

## 2017-04-10 DIAGNOSIS — Z808 Family history of malignant neoplasm of other organs or systems: Secondary | ICD-10-CM | POA: Insufficient documentation

## 2017-04-10 DIAGNOSIS — K219 Gastro-esophageal reflux disease without esophagitis: Secondary | ICD-10-CM | POA: Insufficient documentation

## 2017-04-10 DIAGNOSIS — F329 Major depressive disorder, single episode, unspecified: Secondary | ICD-10-CM

## 2017-04-10 MED ORDER — ESCITALOPRAM OXALATE 20 MG PO TABS: ORAL_TABLET | ORAL | 5 refills | Status: DC

## 2017-04-10 NOTE — Patient Instructions (Addendum)
[name] was seen today for follow-up.  Diagnoses and all orders for this visit:  Anxiety and depression -     escitalopram (LEXAPRO) 20 MG tablet; Take 1/2 tablet by mouth for one week, then 1 whole tablet   [facility] compliance  F/u in 6 weeks with Dr. Laural BenesJohnson for depression  Dr. Armen PickupFunches

## 2017-04-10 NOTE — BH Specialist Note (Signed)
Integrated Behavioral Health Follow Up Visit  MRN: [id number] Name: Sean MarlinJustin Ginty   Session Start time: 9:50 AM Session End time: 10:10 AM Total time: 20 minutes Number of Integrated Behavioral Health Clinician visits: 2/10  Type of Service: Integrated Behavioral Health- Individual/Family Interpretor:No. Interpretor Name and Language: N/A   Warm Hand Off Completed.       SUBJECTIVE: Sean Park is a 26 y.o. male accompanied by patient. Patient was referred by Dr. Armen PickupFunches for depression and anxiety. Patient reports the following symptoms/concerns: overwhelming feelings of sadness and worry, difficulty staying asleep, irritability, chronic pain, and suicidal ideations Duration of problem: 3 months; Severity of problem: severe  OBJECTIVE: Mood: Depressed and Affect: Depressed Risk of harm to self or others: Suicidal ideation No plan to harm self or others   LIFE CONTEXT: Family and Social: Pt receives support from girlfriend. Father has terminal brain cancer and mother was recently informed of cancer returning in liver. Pt's brother is dependent on substances and needs financial assistance often School/Work: Pt is employed as a Chemical engineermanager Self-Care: Pt enjoys walking, listening to music, and praying. He reports smoking marijuana twice a day to assist in stabilizing mood and sleeping Life Changes: Pt is experiencing high stress from providing for older sibling, employment, and both parents having ongoing medical concerns. Pt is grieving the loss of close relative  GOALS ADDRESSED: Patient will reduce symptoms of: anxiety and depression and increase knowledge and/or ability of: coping skills and also: Increase adequate support systems for patient/family, Improve medication compliance and Begin healthy grieving over loss  INTERVENTIONS: Solution-Focused Strategies, Supportive Counseling, Psychoeducation and/or Health Education and Link to [facility] Standardized Assessments  completed: GAD-7 and PHQ 2&9 with C-SSRS  ASSESSMENT: Patient currently experiencing depression and anxiety triggered by the death of a loved one, ongoing stress about medical conditions of parents and job. He reports overwhelming feelings of sadness and worry, difficulty staying asleep, irritability, chronic pain, and suicidal ideations. Patient reports SI; however, denies plan or intent to harm self or others. He is aware of crisis intervention resources. Patient may benefit from psychotherapy and medication management. [facility] educated pt on the stages of grief and provided grief support services. Pt is open to re-initiating medication management through PCP again. LCSWA strongly encouraged psychotherapy and provided employment resources to assist in obtaining new employment, per pt's request.   PLAN: 1. Follow up with behavioral health clinician on : Pt was encouraged tocontact [facility]CSWA if symptoms worsen or fail to improveto [facility]. 2. Behavioral recommendations: LCSWA recommends that pt apply healthy coping skills discussed, comply with prescribed medication, and utilize provided resources. Pt is encouraged to schedule follow up appointment with [facility] 3. Referral(s): [facility] Health Services (LME/Outside Clinic) and Community Resources:  Finances 4. "From scale of 1-10, how likely are you to follow plan?": 7/10  Bridgett LarssonJasmine D Lewis, LCSW 04/15/17 4:14 PM

## 2017-04-10 NOTE — Progress Notes (Signed)
Subjective:  Patient ID: Sean Park, male    DOB: 02/13/1991  Age: 26 y.o. MRN: [id number]  CC: Follow-up   HPI Sean Park has hx of anxiety, GERD he  Presents with his girlfriend  for   1. Anxiety and depression: he has anxiety and depression symptoms that wax and wane. He reports feeling depressed currently. Thoughts of being better off dead.  No plan. Triggers are dad with terminal brain cancer. He feels his dad is "giving up". His dad has elected to no longer undergo treatment. Older brother with cocaine abuse and asking for money. Mother with history of breast cancer recent new of lesion in liver that has been confirmed to be recurrent cancer. He reports today that his 28 yo cousin with Schizophrenia committed suicide. He took lexapro 10 mg for nearly two weeks but stopped stating it did not improve his symptoms. He knows that it takes up to 6 weeks for the medicine to take effect. He denies GI upset, fatigue and dizziness while on the medicine. He denies suicidal ideation. He reports feeling dead inside.     Social History  Substance Use Topics  . Smoking status: Former Smoker    Types: Cigarettes    Quit date: 09/15/2014  . Smokeless tobacco: Never Used  . Alcohol use No   Outpatient Medications Prior to Visit  Medication Sig Dispense Refill  . escitalopram (LEXAPRO) 10 MG tablet Take one tablet by mouth for 14 days then increase to two tablets daily 42 tablet 0  . fluocinonide ointment (LIDEX) 0.05 % Apply 1 application topically 2 (two) times daily. To rash on palms 60 g 2  . meloxicam (MOBIC) 15 MG tablet Take 1 tablet (15 mg total) by mouth daily. For 5 days, then daily AS NEEDED after that. 15 tablet 0  . Multiple Vitamin (MULTIVITAMIN WITH MINERALS) TABS tablet Take 1 tablet by mouth daily.     No facility-administered medications prior to visit.     ROS Review of Systems  Constitutional: Negative for chills, fatigue, fever and unexpected weight change.  Eyes:  Negative for visual disturbance.  Respiratory: Negative for cough and shortness of breath.   Cardiovascular: Negative for chest pain, palpitations and leg swelling.  Gastrointestinal: Negative for abdominal pain, blood in stool, constipation, diarrhea, nausea and vomiting.  Endocrine: Negative for polydipsia, polyphagia and polyuria.  Musculoskeletal: Positive for arthralgias. Negative for back pain, gait problem, myalgias and neck pain.  Skin: Positive for rash.  Allergic/Immunologic: Negative for immunocompromised state.  Neurological: Positive for numbness (in anterior neck, L chest and L shoulder).  Hematological: Negative for adenopathy. Does not bruise/bleed easily.  Psychiatric/Behavioral: Positive for dysphoric mood. Negative for sleep disturbance and suicidal ideas. The patient is nervous/anxious.     Objective:  BP 110/67   Pulse (!) 56   Temp 98.6 F (37 C) (Oral)   Resp 16   Wt 153 lb 6.4 oz (69.6 kg)   SpO2 100%   BMI 21.39 kg/m   BP/Weight 04/10/2017 04/01/2017 03/13/2017  Systolic BP 110 110 100  Diastolic BP 67 70 58  Wt. (Lbs) 153.4 156 157  BMI 21.39 21.76 22.53   Physical Exam  Constitutional: He appears well-developed and well-nourished. No distress.  HENT:  Head: Normocephalic and atraumatic.  Neck: Normal range of motion. Neck supple.  Cardiovascular: Normal rate, regular rhythm, normal heart sounds and intact distal pulses.   Pulmonary/Chest: Effort normal and breath sounds normal.  Musculoskeletal: He exhibits no edema.  Left shoulder: He exhibits tenderness and pain. He exhibits normal range of motion, no bony tenderness, no swelling, no effusion, no crepitus, no deformity, no laceration, no spasm, normal pulse and normal strength.       Hands: Neurological: He is alert.  Skin: Skin is warm and dry. No rash noted. No erythema.     Psychiatric: He exhibits a depressed mood.   Depression screen [facility] 2/9 04/10/2017 03/13/2017 12/25/2016  Decreased  Interest 2 1 2   Down, Depressed, Hopeless 2 2 1   PHQ - 2 Score 4 3 3   Altered sleeping 1 1 1   Tired, decreased energy 2 2 1   Change in appetite 1 1 1   Feeling bad or failure about yourself  1 2 1   Trouble concentrating 1 1 1   Moving slowly or fidgety/restless 1 1 0  Suicidal thoughts 1 1 1   PHQ-9 Score 12 12 9    GAD 7 : Generalized Anxiety Score 04/10/2017 03/13/2017 12/25/2016 11/14/2016  Nervous, Anxious, on Edge 2 1 1 2   Control/stop worrying 2 1 2 1   Worry too much - different things 2 2 1 1   Trouble relaxing 1 1 1 1   Restless 1 1 1 1   Easily annoyed or irritable 2 2 1 1   Afraid - awful might happen 2 3 1 2   Total GAD 7 Score 12 11 8 9      Assessment & Plan:  [name] was seen today for follow-up.  Diagnoses and all orders for this visit:  Anxiety and depression -     escitalopram (LEXAPRO) 20 MG tablet; Take 1/2 tablet by mouth for one week, then 1 whole tablet  There are no diagnoses linked to this encounter.  No orders of the defined types were placed in this encounter.   Follow-up: Return in about 2 weeks (around 04/24/2017) for depression.   Dessa PhiJosalyn Cheyna Retana MD

## 2017-04-10 NOTE — Progress Notes (Signed)
Pt states his anxiety and depression is not getting better. Pt states it is defintely getting worse. Pt states he got a call from his mom when he was at work letting him know that his cousin killed himself.  Pt requested to speak with social worker

## 2017-04-10 NOTE — Assessment & Plan Note (Signed)
Anxiety and depression Multiple stressors Recent suicide in family Plan: Restart lexapro 10 mg for one week, then 20 mg F/u in 2 weeks assess compliance

## 2017-04-21 ENCOUNTER — Ambulatory Visit: Payer: Self-pay | Attending: Family Medicine | Admitting: Family Medicine

## 2017-04-21 ENCOUNTER — Ambulatory Visit: Payer: Self-pay

## 2017-04-21 ENCOUNTER — Encounter: Payer: Self-pay | Admitting: Family Medicine

## 2017-04-21 DIAGNOSIS — R0789 Other chest pain: Secondary | ICD-10-CM | POA: Insufficient documentation

## 2017-04-21 DIAGNOSIS — Z808 Family history of malignant neoplasm of other organs or systems: Secondary | ICD-10-CM | POA: Insufficient documentation

## 2017-04-21 DIAGNOSIS — F32A Depression, unspecified: Secondary | ICD-10-CM

## 2017-04-21 DIAGNOSIS — Z87891 Personal history of nicotine dependence: Secondary | ICD-10-CM | POA: Insufficient documentation

## 2017-04-21 DIAGNOSIS — M7582 Other shoulder lesions, left shoulder: Secondary | ICD-10-CM

## 2017-04-21 DIAGNOSIS — Z818 Family history of other mental and behavioral disorders: Secondary | ICD-10-CM | POA: Insufficient documentation

## 2017-04-21 DIAGNOSIS — Z803 Family history of malignant neoplasm of breast: Secondary | ICD-10-CM | POA: Insufficient documentation

## 2017-04-21 DIAGNOSIS — K219 Gastro-esophageal reflux disease without esophagitis: Secondary | ICD-10-CM | POA: Insufficient documentation

## 2017-04-21 DIAGNOSIS — M25512 Pain in left shoulder: Secondary | ICD-10-CM | POA: Insufficient documentation

## 2017-04-21 DIAGNOSIS — F329 Major depressive disorder, single episode, unspecified: Secondary | ICD-10-CM | POA: Insufficient documentation

## 2017-04-21 DIAGNOSIS — F419 Anxiety disorder, unspecified: Secondary | ICD-10-CM | POA: Insufficient documentation

## 2017-04-21 DIAGNOSIS — Z813 Family history of other psychoactive substance abuse and dependence: Secondary | ICD-10-CM | POA: Insufficient documentation

## 2017-04-21 NOTE — Assessment & Plan Note (Signed)
A: chronic anxiety and depression.  Compliant with lexapro for the past 11 days without significant dizziness, mood disturbance or GI upset  P: Increase lexapro to 20 mg daily Continue for at least 6 weeks  F/u in 5 weeks for depression

## 2017-04-21 NOTE — Assessment & Plan Note (Signed)
A: left shoulder and anterior chest pain P: NSAID Heat

## 2017-04-21 NOTE — Progress Notes (Signed)
Subjective:  Patient ID: Sean Park, male    DOB: 02/20/1991  Age: 26 y.o. MRN: [id number]  CC: Follow-up   HPI Sean Park has hx of anxiety, GERD he present for   1. Anxiety and depression: he has anxiety and depression symptoms that wax and wane. He reports feeling depressed currently. Thoughts of being better off dead.  No plan. Triggers are dad with terminal brain cancer. He feels his dad is "giving up". His dad has elected to no longer undergo treatment. Older brother with cocaine abuse and asking for money. Mother with history of breast cancer recent new of lesion in liver that has been confirmed to be recurrent cancer. He reports today that his 62 yo cousin with Schizophrenia committed suicide. He attended the funeral. He reports 3 weeks of decreased interest in sex. He is taking lexapro 10 mg daily.    2. Chest pain:  Left  sided. Muscle soreness. He also has left shoulder pain. Pain is 3/10 most of the time. Pain is up to 7/10 at times. Relieved with massage. He is not taking meloxicam but has some at home.   Social History  Substance Use Topics  . Smoking status: Former Smoker    Types: Cigarettes    Quit date: 09/15/2014  . Smokeless tobacco: Never Used  . Alcohol use No   Outpatient Medications Prior to Visit  Medication Sig Dispense Refill  . escitalopram (LEXAPRO) 20 MG tablet Take 1/2 tablet by mouth for one week, then 1 whole tablet 30 tablet 5  . fluocinonide ointment (LIDEX) 0.05 % Apply 1 application topically 2 (two) times daily. To rash on palms 60 g 2  . meloxicam (MOBIC) 15 MG tablet Take 1 tablet (15 mg total) by mouth daily. For 5 days, then daily AS NEEDED after that. 15 tablet 0  . Multiple Vitamin (MULTIVITAMIN WITH MINERALS) TABS tablet Take 1 tablet by mouth daily.     No facility-administered medications prior to visit.     ROS Review of Systems  Constitutional: Negative for chills, fatigue, fever and unexpected weight change.  Eyes: Negative for  visual disturbance.  Respiratory: Negative for cough and shortness of breath.   Cardiovascular: Negative for chest pain, palpitations and leg swelling.  Gastrointestinal: Negative for abdominal pain, blood in stool, constipation, diarrhea, nausea and vomiting.  Endocrine: Negative for polydipsia, polyphagia and polyuria.  Musculoskeletal: Positive for arthralgias. Negative for back pain, gait problem, myalgias and neck pain.  Skin: Positive for rash.  Allergic/Immunologic: Negative for immunocompromised state.  Neurological: Positive for numbness (in anterior neck, L chest and L shoulder).  Hematological: Negative for adenopathy. Does not bruise/bleed easily.  Psychiatric/Behavioral: Positive for dysphoric mood. Negative for sleep disturbance and suicidal ideas. The patient is nervous/anxious.     Objective:  BP 106/66   Pulse 68   Temp 98.7 F (37.1 C) (Oral)   Resp 16   Wt 156 lb 3.2 oz (70.9 kg)   SpO2 97%   BMI 21.79 kg/m   BP/Weight 04/21/2017 04/10/2017 04/01/2017  Systolic BP 106 110 110  Diastolic BP 66 67 70  Wt. (Lbs) 156.2 153.4 156  BMI 21.79 21.39 21.76   Physical Exam  Constitutional: He appears well-developed and well-nourished. No distress.  HENT:  Head: Normocephalic and atraumatic.  Neck: Normal range of motion. Neck supple.  Cardiovascular: Normal rate, regular rhythm, normal heart sounds and intact distal pulses.   Pulmonary/Chest: Effort normal and breath sounds normal.    Musculoskeletal: He exhibits no  edema.       Left shoulder: He exhibits tenderness and pain. He exhibits normal range of motion, no bony tenderness, no swelling, no effusion, no crepitus, no deformity, no laceration, no spasm, normal pulse and normal strength.       Hands: Neurological: He is alert.  Skin: Skin is warm and dry. No rash noted. No erythema.     Psychiatric: He exhibits a depressed mood.   Depression screen [facility] 2/9 04/21/2017 04/10/2017 03/13/2017  Decreased Interest 1 2 1    Down, Depressed, Hopeless 2 2 2   PHQ - 2 Score 3 4 3   Altered sleeping 1 1 1   Tired, decreased energy 1 2 2   Change in appetite 1 1 1   Feeling bad or failure about yourself  2 1 2   Trouble concentrating 1 1 1   Moving slowly or fidgety/restless 1 1 1   Suicidal thoughts 1 1 1   PHQ-9 Score 11 12 12   Some recent data might be hidden   GAD 7 : Generalized Anxiety Score 04/21/2017 04/10/2017 03/13/2017 12/25/2016  Nervous, Anxious, on Edge 1 2 1 1   Control/stop worrying 2 2 1 2   Worry too much - different things 2 2 2 1   Trouble relaxing 1 1 1 1   Restless 1 1 1 1   Easily annoyed or irritable 2 2 2 1   Afraid - awful might happen 2 2 3 1   Total GAD 7 Score 11 12 11 8      Assessment & Plan:  Sean Park was seen today for follow-up.  Diagnoses and all orders for this visit:  Anxiety and depression  There are no diagnoses linked to this encounter.  No orders of the defined types were placed in this encounter.   Follow-up: No Follow-up on file.   Dessa PhiJosalyn Mekaela Azizi MD

## 2017-04-21 NOTE — Patient Instructions (Addendum)
Sean Park was seen today for follow-up.  Diagnoses and all orders for this visit:  Anxiety and depression   Continue lexapro, please increase dose to 20 mg daily Please pick up refill today   Return in 5 more weeks for follow up of anxiety and depression   Dr. Armen PickupFunches

## 2017-04-29 ENCOUNTER — Ambulatory Visit
Admission: RE | Admit: 2017-04-29 | Discharge: 2017-04-29 | Disposition: A | Discharge status: Home / Self Care | Payer: No Typology Code available for payment source | Source: Ambulatory Visit | Attending: Family Medicine | Admitting: Family Medicine

## 2017-04-29 ENCOUNTER — Ambulatory Visit (INDEPENDENT_AMBULATORY_CARE_PROVIDER_SITE_OTHER): Payer: Self-pay | Admitting: Family Medicine

## 2017-04-29 VITALS — BP 118/70 | Ht 70.0 in | Wt 155.0 lb

## 2017-04-29 DIAGNOSIS — M79641 Pain in right hand: Secondary | ICD-10-CM

## 2017-04-29 DIAGNOSIS — G2589 Other specified extrapyramidal and movement disorders: Secondary | ICD-10-CM

## 2017-04-29 DIAGNOSIS — M7582 Other shoulder lesions, left shoulder: Secondary | ICD-10-CM

## 2017-04-29 MED ORDER — NAPROXEN 500 MG PO TABS: ORAL_TABLET | ORAL | 0 refills | Status: DC

## 2017-04-29 NOTE — Assessment & Plan Note (Signed)
Patient is also here for subacute right hand pain. Physical exam and history most consistent with fifth metacarpal fracture. No evidence of rotational defect of the digits. Patient is able to make a fist without issue. There is some evidence of shortening of the suspected metacarpal. - Ice, heat, anti-inflammatories (i.e. naproxen as above) - X-ray of the right hand obtained >> will contact patient with results and any further treatment courses. - Patient placed in wrist brace with immobilization of the fourth and fifth digits.  Next: If metacarpal fracture, depending on patient's preference referral to orthopedics may be warranted if he feels the correction of metacarpal shortening is warranted, otherwise, without rotational changes no surgical intervention is likely necessary. Would continue immobilization for an additional 1-2 weeks.

## 2017-04-29 NOTE — Assessment & Plan Note (Signed)
Patient is here to follow-up on his rotator cuff tendinitis. Symptoms have improved since our last visit. Scapular dyskinesia has also improved. I discussed further treatment options and although patient would likely benefit from a subacromial steroid injection patient is self-pay and would likely be unable to afford this. Patient stated his understanding and agreement that he would like to continue a less expensive route. - Continue ice/heat as needed - Continue strengthening exercises at home, discussed additional exercises involving forward row and upright row - Discontinue Mobic >> initiate naproxen twice a day 5 days then twice a day when necessary after that.

## 2017-04-29 NOTE — Assessment & Plan Note (Signed)
Improved. See plan above

## 2017-04-29 NOTE — Progress Notes (Signed)
   Subjective:    Patient ID: Sean MarlinJustin Park, male    DOB: 06/24/1991, 26 y.o.   MRN: [id number]  HPI  L shoulder pain -Improved, 3/10 currently -prescribed Mobic, stopped taking 1.5 weeks ago  -is doing shoulder strengthening exercises -denies numbness/tingling  R hand pain -2 weeks ago roughhousing with brother, brother accidentally kicked his hand -5th MCP joint pain -been icing it -pain has improved some but is still there, dull in nature, no numbness or tingling -normal ROM   Review of Systems     Objective:   Physical Exam        Assessment & Plan:

## 2017-04-29 NOTE — Progress Notes (Signed)
HPI  CC: Follow-up left shoulder pain; new right hand pain  L shoulder pain Patient is here to follow-up on his left shoulder pain. At the last visit we gave him Mobic and home exercises. He states that he has been compliant with both regimens and he has some improvement. He continues to have some tenderness along the lateral aspect of the shoulder and rhomboid. He denies any setbacks or injuries. -Improved, 3/10 currently -prescribed Mobic, stopped taking 1.5 weeks ago  -is doing shoulder strengthening exercises -denies numbness/tingling  R hand pain Patient has a new complaint today regarding his right hand. He states that approximately 2 weeks ago he was roughhousing with his brother and was hit on the ulnar aspect of the right hand. He had significant pain ever since. He endorses some ecchymotic changes shortly after this injury as well as some swelling. He denies any changes in his range of motion of the affected wrist and fingers. He is still able to make a fist. He denies any noticeable changes to the directionality of his fingers. -2 weeks ago roughhousing with brother, brother accidentally kicked his hand -5th MCP joint pain -been icing it -pain has improved some but is still there, dull in nature, no numbness or tingling -normal ROM   See HPI and/or previous note for associated ROS.  Objective: BP 118/70   Ht 5\' 10"  (1.778 m)   Wt 155 lb (70.3 kg)   BMI 22.24 kg/m  Gen: Rt-Hand Dominant. NAD, well groomed, a/o x3, normal affect.  CV: Well-perfused. Warm.  Resp: Non-labored.  Neuro: Sensation intact throughout. No gross coordination deficits.  Gait: Nonpathologic posture, unremarkable stride without signs of limp or balance issues. Shoulder, Left: No evidence of bony deformity, asymmetry, or muscle atrophy; Mild tenderness over long head of biceps (bicipital groove). No TTP at [facility] joint. Improved but still present TTP at the posterior lateral aspect of the shoulder and  ipsilateral inferior rhomboid. Full active and passive range of motion. Strength 5/5 throughout. Abnormal (but improved) scapular function observed (specifically with forward flexion). Sensation intact. Peripheral pulses intact.             Special Tests:                         - Crossarm test: NEG for AC pain; POS for posterior shoulder pain                         - Empty can: Positive                         - Neer test: Positive Hand/Wrist, Right: TTP noted at the fifth metacarpal midshaft. Inspection yielded no erythema, ecchymosis, or swelling. Slight bony deformity with decreased prominence of fifth knuckle compared to the left, and slight bony prominence on the dorsal aspect of the right fifth metacarpal. ROM full with good flexion and extension and ulnar/radial deviation that is symmetrical with opposite wrist. Palpation yielded tender bony prominence on the dorsal aspect of the fifth metacarpal. No abnormal findings with palpation of the scaphoid, lunate, and TFCC; tendons without tenderness/swelling. Strength 5/5 in all directions without pain. Negative Finkelstein, tinel's and phalens.   Assessment and plan:  Rotator cuff tendinitis, left Patient is here to follow-up on his rotator cuff tendinitis. Symptoms have improved since our last visit. Scapular dyskinesia has also improved. I discussed further treatment options and  although patient would likely benefit from a subacromial steroid injection patient is self-pay and would likely be unable to afford this. Patient stated his understanding and agreement that he would like to continue a less expensive route. - Continue ice/heat as needed - Continue strengthening exercises at home, discussed additional exercises involving forward row and upright row - Discontinue Mobic >> initiate naproxen twice a day 5 days then twice a day when necessary after that.  Scapular dyskinesis Improved. See plan above  Right hand pain Patient is also here  for subacute right hand pain. Physical exam and history most consistent with fifth metacarpal fracture. No evidence of rotational defect of the digits. Patient is able to make a fist without issue. There is some evidence of shortening of the suspected metacarpal. - Ice, heat, anti-inflammatories (i.e. naproxen as above) - X-ray of the right hand obtained >> will contact patient with results and any further treatment courses. - Patient placed in wrist brace with immobilization of the fourth and fifth digits.  Next: If metacarpal fracture, depending on patient's preference referral to orthopedics may be warranted if he feels the correction of metacarpal shortening is warranted, otherwise, without rotational changes no surgical intervention is likely necessary. Would continue immobilization for an additional 1-2 weeks.   Orders Placed This Encounter  Procedures  . DG Hand Complete Right    Standing Status:   Future    Number of Occurrences:   1    Standing Expiration Date:   06/29/2018    Order Specific Question:   Reason for Exam (SYMPTOM  OR DIAGNOSIS REQUIRED)    Answer:   right hand trauma to 5th metacarpal; AP, lateral, and oblique views    Order Specific Question:   Preferred imaging location?    Answer:   [facility]    Order Specific Question:   Radiology Contrast Protocol - do NOT remove file path    Answer:   [email or web address]    Meds ordered this encounter  Medications  . naproxen (NAPROSYN) 500 MG tablet    Sig: Take 1 tablet twice a day, every day, for 5 days. Then take 1 tablet twice a day AS NEEDED after that.    Dispense:  60 tablet    Refill:  0     Kathee DeltonIan D Lothar Prehn, MD,MS [facility] Medicine Fellow 04/29/2017 9:08 PM

## 2017-05-01 ENCOUNTER — Telehealth: Payer: Self-pay | Admitting: Family Medicine

## 2017-05-01 NOTE — Telephone Encounter (Signed)
Sports Medicine After hours phone call  Called patient with XR results. Discussed healing 5th metacarpal frxr. Discussed Tx options including surgical intervention with refracturing and re-lengthening of the injured metacarpal. I also discussed the possibility of conservative therapy and informed him that as long as he continues to have no significant issues with range of motion, making a fist, weakness, numbness, paresthesias, or pain then an option would be nonsurgical. Patient thanked me for this information and stated that he would like to go forward with the conservative management. He did not think at this time that it was significant enough for surgical intervention at this time.  Informed patient that brace provided at the last visit did not need to be continued for the full 2-4 weeks as previously discussed. He can use this as needed for comfort. Patient is to follow-up as needed.  Patient had no further questions.  Kathee Delton, MD,MS 05/01/2017 3:53 PM

## 2017-05-28 ENCOUNTER — Ambulatory Visit: Payer: Self-pay | Admitting: Family Medicine

## 2017-07-08 ENCOUNTER — Ambulatory Visit: Payer: Self-pay | Attending: Family Medicine | Admitting: Family Medicine

## 2017-07-08 ENCOUNTER — Encounter: Payer: Self-pay | Admitting: Family Medicine

## 2017-07-08 VITALS — BP 99/61 | HR 74 | Temp 98.3°F | Ht 70.0 in | Wt 151.6 lb

## 2017-07-08 DIAGNOSIS — F32A Depression, unspecified: Secondary | ICD-10-CM

## 2017-07-08 DIAGNOSIS — M7582 Other shoulder lesions, left shoulder: Secondary | ICD-10-CM

## 2017-07-08 DIAGNOSIS — M75102 Unspecified rotator cuff tear or rupture of left shoulder, not specified as traumatic: Secondary | ICD-10-CM | POA: Insufficient documentation

## 2017-07-08 DIAGNOSIS — Z13228 Encounter for screening for other metabolic disorders: Secondary | ICD-10-CM

## 2017-07-08 DIAGNOSIS — Z1389 Encounter for screening for other disorder: Secondary | ICD-10-CM | POA: Insufficient documentation

## 2017-07-08 DIAGNOSIS — K219 Gastro-esophageal reflux disease without esophagitis: Secondary | ICD-10-CM | POA: Insufficient documentation

## 2017-07-08 DIAGNOSIS — F329 Major depressive disorder, single episode, unspecified: Secondary | ICD-10-CM | POA: Insufficient documentation

## 2017-07-08 DIAGNOSIS — Z79899 Other long term (current) drug therapy: Secondary | ICD-10-CM | POA: Insufficient documentation

## 2017-07-08 DIAGNOSIS — F419 Anxiety disorder, unspecified: Secondary | ICD-10-CM | POA: Insufficient documentation

## 2017-07-08 MED ORDER — NAPROXEN 500 MG PO TABS: ORAL_TABLET | ORAL | 0 refills | Status: DC

## 2017-07-08 NOTE — Progress Notes (Signed)
Subjective:  Patient ID: Sean Park, male    DOB: [birth year]  Age: 26 y.o. MRN: [id number]  CC: Establish Care and Shoulder Pain   HPI Sean Park presents is a 26 year old male with a history of anxiety and depression, left rotator cuff tendinitis who presents today to establish care with me. He was previously followed by Dr.Funches.  Seen by sports medicine 2 months ago and was placed on naproxen which he reports helped some. 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 Pain sometimes radiates to his back and to his anterior chest wall and he sometimes experiences numbness in his left hand but denies dropping things.  He was placed on Lexapro for anxiety and depression but informs me he no longer needs it. This source of his symptoms are his mom who is battling metastasized breast cancer at the cancer centers of [place] and his dad who has brain cancer ans is at [facility] He states he is coping well at this time and denies suicidal ideation or intents or the need for any medications. He has no acute concerns today.  Past Medical History:  Diagnosis Date  . Anemia   . Anxiety   . Bronchitis   . Dysphagia   . GERD (gastroesophageal reflux disease)   . Muscle spasm   . Rotator cuff tear     No past surgical history on file.  No Known Allergies   Outpatient Medications Prior to Visit  Medication Sig Dispense Refill  . Multiple Vitamin (MULTIVITAMIN WITH MINERALS) TABS tablet Take 1 tablet by mouth daily.    Marland Kitchen escitalopram (LEXAPRO) 20 MG tablet Take 1/2 tablet by mouth for one week, then 1 whole tablet (Patient not taking: Reported on 07/08/2017) 30 tablet 5  . fluocinonide ointment (LIDEX) 0.31 % Apply 1 application topically 2 (two) times daily. To rash on palms (Patient not taking: Reported on 07/08/2017) 60 g 2  . naproxen (NAPROSYN) 500 MG tablet Take  1 tablet twice a day, every day, for 5 days. Then take 1 tablet twice a day AS NEEDED after that. (Patient not taking: Reported on 07/08/2017) 60 tablet 0   No facility-administered medications prior to visit.     ROS Review of Systems  Constitutional: Negative for activity change and appetite change.  HENT: Negative for sinus pressure and sore throat.   Eyes: Negative for visual disturbance.  Respiratory: Negative for cough, chest tightness and shortness of breath.   Cardiovascular: Negative for chest pain and leg swelling.  Gastrointestinal: Negative for abdominal distention, abdominal pain, constipation and diarrhea.  Endocrine: Negative.   Genitourinary: Negative for dysuria.  Musculoskeletal: Negative for joint swelling and myalgias.       See hpi  Skin: Negative for rash.  Allergic/Immunologic: Negative.   Neurological: Negative for weakness, light-headedness and numbness.  Psychiatric/Behavioral: Negative for dysphoric mood and suicidal ideas.    Objective:  BP 99/61   Pulse 74   Temp 98.3 F (36.8 C) (Oral)   Ht 5' 10"  (1.778 m)   Wt 151 lb 9.6 oz (68.8 kg)   SpO2 99%   BMI 21.75 kg/m   BP/Weight 07/08/2017 5/94/5859 10/24/2444  Systolic BP 99 286 381  Diastolic BP 61 70 66  Wt. (Lbs) 151.6 155 156.2  BMI 21.75 22.24 21.79      Physical Exam  Constitutional: He is oriented to person, place, and  time. He appears well-developed and well-nourished.  Cardiovascular: Normal rate, normal heart sounds and intact distal pulses.   No murmur heard. Pulmonary/Chest: Effort normal and breath sounds normal. He has no wheezes. He has no rales. He exhibits no tenderness.  Abdominal: Soft. Bowel sounds are normal. He exhibits no distension and no mass. There is no tenderness.  Musculoskeletal: Normal range of motion. He exhibits no tenderness.  Active abduction of left shoulder up to 150 degrees  Neurological: He is alert and oriented to person, place, and time.  Skin:    Scaly rash on thenar prominence of both palms  Psychiatric: He has a normal mood and affect.     Assessment & Plan:   1. Rotator cuff tendinitis, left Pain is minimal at this time Use ice/heat as needed - naproxen (NAPROSYN) 500 MG tablet; Take 1 tablet twice a day, every day, for 5 days. Then take 1 tablet twice a day AS NEEDED after that.  Dispense: 60 tablet; Refill: 0  2. Anxiety and depression This stems from parents terminal illnesses No no longer needs Lexapro which I have discontinued He has a good support system and is coping well  3. Screening for metabolic disorder - ODQ55+QITU - Lipid panel - CBC with Differential/Platelet   Meds ordered this encounter  Medications  . naproxen (NAPROSYN) 500 MG tablet    Sig: Take 1 tablet twice a day, every day, for 5 days. Then take 1 tablet twice a day AS NEEDED after that.    Dispense:  60 tablet    Refill:  0    Follow-up: Return in about 6 months (around 01/06/2018) for Coordination of care.   Arnoldo Morale MD

## 2017-07-09 LAB — CBC WITH DIFFERENTIAL/PLATELET
BASOS ABS: 0.1 10*3/uL (ref 0.0–0.2)
BASOS: 2 %
EOS (ABSOLUTE): 0.2 10*3/uL (ref 0.0–0.4)
Eos: 6 %
HEMATOCRIT: 44.4 % (ref 37.5–51.0)
HEMOGLOBIN: 13.5 g/dL (ref 13.0–17.7)
IMMATURE GRANS (ABS): 0 10*3/uL (ref 0.0–0.1)
Immature Granulocytes: 0 %
LYMPHS ABS: 1.9 10*3/uL (ref 0.7–3.1)
LYMPHS: 44 %
MCH: 24.2 pg — AB (ref 26.6–33.0)
MCHC: 30.4 g/dL — ABNORMAL LOW (ref 31.5–35.7)
MCV: 80 fL (ref 79–97)
MONOCYTES: 6 %
Monocytes Absolute: 0.2 10*3/uL (ref 0.1–0.9)
NEUTROS ABS: 1.8 10*3/uL (ref 1.4–7.0)
Neutrophils: 42 %
Platelets: 229 10*3/uL (ref 150–379)
RBC: 5.58 x10E6/uL (ref 4.14–5.80)
RDW: 14 % (ref 12.3–15.4)
WBC: 4.2 10*3/uL (ref 3.4–10.8)

## 2017-07-09 LAB — LIPID PANEL
CHOL/HDL RATIO: 2.6 ratio (ref 0.0–5.0)
CHOLESTEROL TOTAL: 134 mg/dL (ref 100–199)
HDL: 51 mg/dL (ref >39)
LDL Calculated: 76 mg/dL (ref 0–99)
TRIGLYCERIDES: 35 mg/dL (ref 0–149)
VLDL Cholesterol Cal: 7 mg/dL (ref 5–40)

## 2017-07-09 LAB — CMP14+EGFR
ALBUMIN: 4.2 g/dL (ref 3.5–5.5)
ALT: 12 IU/L (ref 0–44)
AST: 16 IU/L (ref 0–40)
Albumin/Globulin Ratio: 1.6 (ref 1.2–2.2)
Alkaline Phosphatase: 50 IU/L (ref 39–117)
BILIRUBIN TOTAL: 0.4 mg/dL (ref 0.0–1.2)
BUN/Creatinine Ratio: 9 (ref 9–20)
BUN: 8 mg/dL (ref 6–20)
CO2: 29 mmol/L (ref 20–29)
CREATININE: 0.9 mg/dL (ref 0.76–1.27)
Calcium: 9.3 mg/dL (ref 8.7–10.2)
Chloride: 102 mmol/L (ref 96–106)
GFR calc Af Amer: 135 mL/min/{1.73_m2} (ref >59)
GFR, EST NON AFRICAN AMERICAN: 117 mL/min/{1.73_m2} (ref >59)
GLUCOSE: 91 mg/dL (ref 65–99)
Globulin, Total: 2.7 g/dL (ref 1.5–4.5)
POTASSIUM: 4.2 mmol/L (ref 3.5–5.2)
Sodium: 143 mmol/L (ref 134–144)
TOTAL PROTEIN: 6.9 g/dL (ref 6.0–8.5)

## 2017-07-15 ENCOUNTER — Telehealth: Payer: Self-pay

## 2017-07-15 NOTE — Telephone Encounter (Signed)
Pt was called and informed of lab results. 

## 2017-11-14 ENCOUNTER — Emergency Department (HOSPITAL_COMMUNITY)
Admission: EM | Admit: 2017-11-14 | Discharge: 2017-11-14 | Disposition: A | Discharge status: Home / Self Care | Payer: Self-pay | Attending: Emergency Medicine | Admitting: Emergency Medicine

## 2017-11-14 ENCOUNTER — Encounter (HOSPITAL_COMMUNITY): Payer: Self-pay

## 2017-11-14 ENCOUNTER — Other Ambulatory Visit: Payer: Self-pay

## 2017-11-14 DIAGNOSIS — Z79899 Other long term (current) drug therapy: Secondary | ICD-10-CM | POA: Insufficient documentation

## 2017-11-14 DIAGNOSIS — K279 Peptic ulcer, site unspecified, unspecified as acute or chronic, without hemorrhage or perforation: Secondary | ICD-10-CM

## 2017-11-14 DIAGNOSIS — K273 Acute peptic ulcer, site unspecified, without hemorrhage or perforation: Secondary | ICD-10-CM | POA: Insufficient documentation

## 2017-11-14 DIAGNOSIS — Z87891 Personal history of nicotine dependence: Secondary | ICD-10-CM | POA: Insufficient documentation

## 2017-11-14 LAB — CBC
HCT: 41.8 % (ref 39.0–52.0)
Hemoglobin: 13.1 g/dL (ref 13.0–17.0)
MCH: 24.4 pg — AB (ref 26.0–34.0)
MCHC: 31.3 g/dL (ref 30.0–36.0)
MCV: 77.8 fL — ABNORMAL LOW (ref 78.0–100.0)
PLATELETS: 224 10*3/uL (ref 150–400)
RBC: 5.37 MIL/uL (ref 4.22–5.81)
RDW: 12.7 % (ref 11.5–15.5)
WBC: 4.9 10*3/uL (ref 4.0–10.5)

## 2017-11-14 LAB — URINALYSIS, ROUTINE W REFLEX MICROSCOPIC
BILIRUBIN URINE: NEGATIVE
Glucose, UA: NEGATIVE mg/dL
HGB URINE DIPSTICK: NEGATIVE
KETONES UR: NEGATIVE mg/dL
Leukocytes, UA: NEGATIVE
Nitrite: NEGATIVE
PROTEIN: NEGATIVE mg/dL
Specific Gravity, Urine: 1.016 (ref 1.005–1.030)
pH: 7 (ref 5.0–8.0)

## 2017-11-14 LAB — COMPREHENSIVE METABOLIC PANEL
ALK PHOS: 40 U/L (ref 38–126)
ALT: 20 U/L (ref 17–63)
AST: 22 U/L (ref 15–41)
Albumin: 4.2 g/dL (ref 3.5–5.0)
Anion gap: 10 (ref 5–15)
BUN: 7 mg/dL (ref 6–20)
CO2: 27 mmol/L (ref 22–32)
CREATININE: 0.98 mg/dL (ref 0.61–1.24)
Calcium: 9.1 mg/dL (ref 8.9–10.3)
Chloride: 104 mmol/L (ref 101–111)
GFR calc non Af Amer: >60 mL/min (ref >60)
Glucose, Bld: 109 mg/dL — ABNORMAL HIGH (ref 65–99)
Potassium: 3.6 mmol/L (ref 3.5–5.1)
SODIUM: 141 mmol/L (ref 135–145)
Total Bilirubin: 0.8 mg/dL (ref 0.3–1.2)
Total Protein: 7.1 g/dL (ref 6.5–8.1)

## 2017-11-14 LAB — LIPASE, BLOOD: Lipase: 27 U/L (ref 11–51)

## 2017-11-14 MED ORDER — PANTOPRAZOLE SODIUM 40 MG PO TBEC
40.0000 mg | DELAYED_RELEASE_TABLET | Freq: Once | ORAL | Status: AC
  Administered 2017-11-14: 40 mg via ORAL
  Filled 2017-11-14: qty 1

## 2017-11-14 MED ORDER — GI COCKTAIL ~~LOC~~
30.0000 mL | Freq: Once | ORAL | Status: AC
  Administered 2017-11-14: 30 mL via ORAL
  Filled 2017-11-14: qty 30

## 2017-11-14 MED ORDER — PANTOPRAZOLE SODIUM 20 MG PO TBEC: 40.0000 mg | DELAYED_RELEASE_TABLET | Freq: Every day | ORAL | 0 refills | Status: DC

## 2017-11-14 MED ORDER — SUCRALFATE 1 G PO TABS: 1.0000 g | ORAL_TABLET | Freq: Three times a day (TID) | ORAL | 0 refills | Status: DC

## 2017-11-14 NOTE — ED Provider Notes (Signed)
[facility] Provider Note   CSN: [id number] Arrival date & time: 11/14/17  0038    History   Chief Complaint Chief Complaint  Patient presents with  . Abdominal Pain  . Emesis    HPI Sean Park is a 27 y.o. male.  27 year old male with a history of reflux presents to the emergency department for evaluation of nausea and vomiting.  He reports worsening epigastric pain with increased nausea.  He experienced vomiting today after drinking water.  Emesis was brown in color.  Vomiting has largely subsided and patient had a bowel movement this morning.  He does report eating spicy foods frequently.  He discontinued his Pepcid and Carafate 6 months ago as he felt he no longer required these medicines.  He has no history of abdominal surgeries.  No associated fevers or urinary symptoms.   The history is provided by the patient. No language interpreter was used.    Past Medical History:  Diagnosis Date  . Anemia   . Anxiety   . Bronchitis   . Dysphagia   . GERD (gastroesophageal reflux disease)   . Muscle spasm   . Rotator cuff tear     Patient Active Problem List   Diagnosis Date Noted  . Rotator cuff tendinitis, left 04/01/2017  . Scapular dyskinesis 04/01/2017  . Dyshidrotic eczema 02/05/2017  . Right hand pain 12/25/2016  . Impingement syndrome of left shoulder 04/13/2015  . Anxiety and depression 03/13/2015  . GERD (gastroesophageal reflux disease) 03/02/2015    History reviewed. No pertinent surgical history.     Home Medications    Prior to Admission medications   Medication Sig Start Date End Date Taking? Authorizing Provider  Multiple Vitamin (MULTIVITAMIN WITH MINERALS) TABS tablet Take 1 tablet by mouth daily.    [provider]  naproxen (NAPROSYN) 500 MG tablet Take 1 tablet twice a day, every day, for 5 days. Then take 1 tablet twice a day AS NEEDED after that. 07/08/17   Hoy RegisterNewlin, Enobong, MD  pantoprazole  (PROTONIX) 20 MG tablet Take 2 tablets (40 mg total) by mouth daily. 11/14/17   Antony MaduraHumes, Krisna Omar, PA-C  sucralfate (CARAFATE) 1 g tablet Take 1 tablet (1 g total) by mouth 4 (four) times daily -  with meals and at bedtime. 11/14/17   Antony MaduraHumes, Xaivier Malay, PA-C    Family History Family History  Problem Relation Age of Onset  . Diabetes Father   . Anxiety disorder Father   . Breast cancer Mother   . Crohn's disease Brother   . Schizophrenia Cousin   . Depression Maternal Aunt   . Colon cancer Neg Hx   . Esophageal cancer Neg Hx   . Stomach cancer Neg Hx   . Rectal cancer Neg Hx     Social History Social History   Tobacco Use  . Smoking status: Former Smoker    Types: Cigarettes    Last attempt to quit: 09/15/2014    Years since quitting: 3.1  . Smokeless tobacco: Never Used  Substance Use Topics  . Alcohol use: Yes    Alcohol/week: 0.0 oz  . Drug use: No     Allergies   Patient has no known allergies.   Review of Systems Review of Systems Ten systems reviewed and are negative for acute change, except as noted in the HPI.    Physical Exam Updated Vital Signs BP 121/70 (BP Location: Left Arm)   Pulse (!) 58   Temp 99.4 F (37.4 C) (  Oral)   Resp 16   Ht 5\' 11"  (1.803 m)   Wt 69.4 kg (153 lb)   SpO2 100%   BMI 21.34 kg/m   Physical Exam  Constitutional: He is oriented to person, place, and time. He appears well-developed and well-nourished. No distress.  Nontoxic appearing and in no acute distress  HENT:  Head: Normocephalic and atraumatic.  Eyes: Conjunctivae and EOM are normal. No scleral icterus.  Neck: Normal range of motion.  Cardiovascular: Normal rate, regular rhythm and intact distal pulses.  Pulmonary/Chest: Effort normal. No stridor. No respiratory distress. He has no wheezes.  Respirations even and unlabored  Abdominal: Soft. He exhibits no mass. There is tenderness. There is no guarding.  Minimal epigastric tenderness.  No abdominal distention or peritoneal  signs.  Negative Murphy sign.  Musculoskeletal: Normal range of motion.  Neurological: He is alert and oriented to person, place, and time. He exhibits normal muscle tone. Coordination normal.  Skin: Skin is warm and dry. No rash noted. He is not diaphoretic. No erythema. No pallor.  Psychiatric: He has a normal mood and affect. His behavior is normal.  Nursing note and vitals reviewed.    ED Treatments / Results  Labs (all labs ordered are listed, but only abnormal results are displayed) Labs Reviewed  COMPREHENSIVE METABOLIC PANEL - Abnormal; Notable for the following components:      Result Value   Glucose, Bld 109 (*)    All other components within normal limits  CBC - Abnormal; Notable for the following components:   MCV 77.8 (*)    MCH 24.4 (*)    All other components within normal limits  LIPASE, BLOOD  URINALYSIS, ROUTINE W REFLEX MICROSCOPIC    EKG  EKG Interpretation None       Radiology No results found.  Procedures Procedures (including critical care time)  Medications Ordered in ED Medications  pantoprazole (PROTONIX) EC tablet 40 mg (40 mg Oral Given 11/14/17 0308)  gi cocktail (Maalox,Lidocaine,Donnatal) (30 mLs Oral Given 11/14/17 0308)     Initial Impression / Assessment and Plan / ED Course  I have reviewed the triage vital signs and the nursing notes.  Pertinent labs & imaging results that were available during my care of the patient were reviewed by me and considered in my medical decision making (see chart for details).     27 year old male presents to the emergency department for complaints of abdominal pain with nausea and vomiting.  His symptoms and physical exam are consistent with peptic ulcer disease, likely gastritis.  He had one episode of coffee-ground emesis prior to arrival.  No subsequent vomiting with stable hemoglobin.  No tachycardia or hypotension.  Emesis appears to be an isolated occurrence, likely from PUD.  Patient to be  restarted back on his Carafate.  He has been given a prescription for Protonix.  Patient to follow-up with his primary care doctor regarding his visit to the emergency department today.  Return precautions discussed and provided. Patient discharged in stable condition with no unaddressed concerns.   Final Clinical Impressions(s) / ED Diagnoses   Final diagnoses:  PUD (peptic ulcer disease)    ED Discharge Orders        Ordered    pantoprazole (PROTONIX) 20 MG tablet  Daily     11/14/17 0302    sucralfate (CARAFATE) 1 g tablet  3 times daily with meals & bedtime     11/14/17 0302       Antony Madura, PA-C 11/14/17  1610    Gerhard Munch, MD 11/14/17 0430

## 2017-11-14 NOTE — Discharge Instructions (Signed)
Your workup in the emergency department today was reassuring.  Your symptoms are consistent with peptic ulcer disease.  Stomach ulcers can occasionally bleed causing brown tinged vomit.  For treatment of this, take Protonix and Carafate as prescribed and call your primary care doctor in the morning to schedule close follow-up.  Avoid spicy foods, acidic fruits, coffee, chocolate, anti-inflammatory medications such as Aleve, ibuprofen, Advil.

## 2017-11-14 NOTE — ED Triage Notes (Signed)
Patient from home with history of acid reflux having new onset nausea and vomiting.  Stated he ate spicy foods last 2 days and began to vomit after dinner.  No vomiting in triage and nausea has passed.  A&Ox4

## 2017-11-26 ENCOUNTER — Ambulatory Visit: Payer: Self-pay | Admitting: Licensed Clinical Social Worker

## 2017-11-26 ENCOUNTER — Other Ambulatory Visit: Payer: Self-pay

## 2017-11-26 ENCOUNTER — Ambulatory Visit: Payer: Self-pay | Attending: Family Medicine | Admitting: Physician Assistant

## 2017-11-26 VITALS — BP 108/69 | HR 60 | Temp 98.7°F | Resp 16 | Ht 71.0 in | Wt 149.0 lb

## 2017-11-26 DIAGNOSIS — R112 Nausea with vomiting, unspecified: Secondary | ICD-10-CM | POA: Insufficient documentation

## 2017-11-26 DIAGNOSIS — F32A Depression, unspecified: Secondary | ICD-10-CM

## 2017-11-26 DIAGNOSIS — F419 Anxiety disorder, unspecified: Secondary | ICD-10-CM | POA: Insufficient documentation

## 2017-11-26 DIAGNOSIS — R1110 Vomiting, unspecified: Secondary | ICD-10-CM | POA: Insufficient documentation

## 2017-11-26 DIAGNOSIS — F4323 Adjustment disorder with mixed anxiety and depressed mood: Secondary | ICD-10-CM

## 2017-11-26 DIAGNOSIS — K279 Peptic ulcer, site unspecified, unspecified as acute or chronic, without hemorrhage or perforation: Secondary | ICD-10-CM | POA: Insufficient documentation

## 2017-11-26 DIAGNOSIS — K219 Gastro-esophageal reflux disease without esophagitis: Secondary | ICD-10-CM | POA: Insufficient documentation

## 2017-11-26 DIAGNOSIS — Z79899 Other long term (current) drug therapy: Secondary | ICD-10-CM | POA: Insufficient documentation

## 2017-11-26 DIAGNOSIS — F4321 Adjustment disorder with depressed mood: Secondary | ICD-10-CM

## 2017-11-26 DIAGNOSIS — F329 Major depressive disorder, single episode, unspecified: Secondary | ICD-10-CM | POA: Insufficient documentation

## 2017-11-26 MED ORDER — OMEPRAZOLE 40 MG PO CPDR: 40.0000 mg | DELAYED_RELEASE_CAPSULE | Freq: Every day | ORAL | 3 refills | Status: DC

## 2017-11-26 MED ORDER — ESCITALOPRAM OXALATE 10 MG PO TABS: 10.0000 mg | ORAL_TABLET | Freq: Every day | ORAL | 3 refills | Status: DC

## 2017-11-26 NOTE — Progress Notes (Signed)
Little discomfort in abdomen F/u PUD

## 2017-11-26 NOTE — BH Specialist Note (Signed)
Integrated Behavioral Health Follow Up Visit  MRN: [id number] Name: Sean Park   Session Start time: 09:50 AM Session End time: 10:20 AM Total time: 30 minutes Number of Integrated Behavioral Health Clinician visits: 3/6  Type of Service: Integrated Behavioral Health- Individual/Family Interpretor:No. Interpretor Name and Language: N/A   Warm Hand Off Completed.       SUBJECTIVE: Sean Park is a 27 y.o. male accompanied by patient. Patient was referred by Dr. Armen PickupFunches for depression and anxiety. Patient reports the following symptoms/concerns: overwhelming feelings of sadness and worry, difficulty staying asleep, feeling bad about self, irritability, chronic pain, and suicidal ideations Duration of problem: 1 year; Severity of problem: severe  OBJECTIVE: Mood: Depressed and Affect: Depressed and Tearful Risk of harm to self or others: Suicidal ideation No plan to harm self or others   LIFE CONTEXT: Family and Social: Pt receives support from fiance. Father has terminal brain cancer and mother recently passed. Pt's brother is dependent on substances and was diagnosed with colon cancer.  School/Work: Pt is unemployed due to having to provide ongoing care to family. Self-Care: Pt enjoys walking, listening to music, and praying. He continues to smoke marijuana daily to assist in stabilizing mood and sleeping Life Changes: Pt is experiencing grief from the recent passing of his mother. His father is in hospice and pt's older sibling was diagnosed with colon cancer.  GOALS ADDRESSED: Patient will reduce symptoms of: anxiety and depression and increase knowledge and/or ability of: coping skills and also: Increase adequate support systems for patient/family, Improve medication compliance and Begin healthy grieving over loss  INTERVENTIONS: Supportive Counseling, Psychoeducation and/or Health Education and Link to [facility] [employer] Standardized Assessments completed: GAD-7 and  PHQ 2&9 with C-SSRS  ASSESSMENT: Patient currently experiencing depression and anxiety triggered by the recent passing of his mother, father's declining health, and older sibling's diagnosis of cancer. Pt is currently unemployed due to having to provide care to loved ones. He reports overwhelming feelings of sadness and worry, difficulty staying asleep, feeling bad about self, irritability, chronic pain, and suicidal ideations. Patient reports SI; however, denies plan or intent to harm self or others. Pt identified protective factors and was provided crisis intervention resources. He receives strong support from fiancee and a cousin.   Patient may benefit from psychotherapy. LCSWA validated pt's feelings and offered encouragement. Pt has agreed to re-initiating medication management through PCP again. LCSWA strongly encouraged pt to initiate grief support services and provided additional behavioral health resources.  PLAN: 1. Follow up with behavioral health clinician on : Pt was encouraged tocontact LCSWA if symptoms worsen or fail to improveto schedule [facility]. 2. Behavioral recommendations: LCSWA recommends that pt apply healthy coping skills discussed, comply with prescribed medication, and utilize provided resources. Pt is encouraged to schedule follow up appointment with LCSWA 3. Referral(s): Integrated Art gallery managerBehavioral Health Services (In Clinic) and Community Mental Health Services (LME/Outside Clinic) 4. "From scale of 1-10, how likely are you to follow plan?":   Bridgett LarssonJasmine D Levonne Carreras, LCSW 11/27/17 3:47 PM

## 2017-11-26 NOTE — Progress Notes (Signed)
Chief Complaint: ED follow up from 11/14/17  Subjective: This is a 27 year old male with a past medical history of depression mixed with anxiety. He also has a history of gastroesophageal reflux disease and left rotator cuff tendinitis. He [facility]  on 11/14/2017 with nausea, vomiting and epigastric discomfort. He had had some coffee ground emesis. He does admit to spicy foods. He previously was on Pepcid and Carafate but discontinued these greater than 6 months ago because he no longer felt that he needed them. His vital signs were stable. His labs which included a CBC, CMP and lipase were all okay. His urinalysis is okay. No imaging. He was diagnosed with peptic ulcer disease Protonix and Carafate were initiated.  He's had improvement in his symptoms with the medications. They were quite costly however. No chest pain. No more coffee ground emesis. Asking if Protonix comes in a cheaper form.  He also recently suffered the death of his mom. His dad is terminally ill. He scored high on the pH daily 9 and we have asked social work to visit with him. He has been on Lexapro in the past but didn't feel like eating longer needed it.   ROS:  GEN: denies fever or chills, denies change in weight Skin: denies lesions or rashes HEENT: denies headache, earache, epistaxis, sore throat, or neck pain LUNGS: denies SHOB, dyspnea, PND, orthopnea CV: denies CP or palpitations ABD: less abd pain, N or V EXT: denies muscle spasms or swelling; no pain in lower ext, no weakness NEURO: denies numbness or tingling, denies sz, stroke or TIA   Objective:  Vitals:   11/26/17 0922  BP: 108/69  Pulse: 60  Resp: 16  Temp: 98.7 F (37.1 C)  TempSrc: Oral  SpO2: 100%  Weight: 149 lb (67.6 kg)  Height: 5\' 11"  (1.803 m)    Physical Exam:  General: in no acute distress. HEENT: no pallor, no icterus, moist oral mucosa, no JVD, no lymphadenopathy Heart: Normal  s1  &s2  Regular rate and rhythm, without murmurs, rubs, gallops. Lungs: Clear to auscultation bilaterally. Abdomen: Soft, mildly diffusely tender, nondistended, positive bowel sounds. Extremities: No clubbing cyanosis or edema with positive pedal pulses. Neuro: Alert, awake, oriented x3, nonfocal.   Medications: Prior to Admission medications   Medication Sig Start Date End Date Taking? Authorizing Provider  Multiple Vitamin (MULTIVITAMIN WITH MINERALS) TABS tablet Take 1 tablet by mouth daily.   Yes [provider]  sucralfate (CARAFATE) 1 g tablet Take 1 tablet (1 g total) by mouth 4 (four) times daily -  with meals and at bedtime. 11/14/17  Yes Antony MaduraHumes, Kelly, PA-C  escitalopram (LEXAPRO) 10 MG tablet Take 1 tablet (10 mg total) by mouth daily. 11/26/17   Vivianne MasterNoel, Caylor Cerino S, PA-C  omeprazole (PRILOSEC) 40 MG capsule Take 1 capsule (40 mg total) by mouth daily. 12/10/17   Vivianne MasterNoel, Dniya Neuhaus S, PA-C    Assessment: 1. PUD 2. Depression   Plan: Cont Carafate, changed PPI > Omeprazole Seen by SW Add lexapro  Follow up:4 weeks  The patient was given clear instructions to go to ER or return to medical center if symptoms don't improve, worsen or new problems develop. The patient verbalized understanding. The patient was told to call to get lab results if they haven't heard anything in the next week.   This note has been created with Education officer, environmentalDragon speech recognition software and smart phrase technology. Any transcriptional errors are unintentional.   Scot Juniffany Tarren Sabree, PA-C 11/26/2017,  9:48 AM

## 2017-12-24 ENCOUNTER — Encounter: Payer: Self-pay | Admitting: Nurse Practitioner

## 2017-12-24 ENCOUNTER — Ambulatory Visit: Payer: Self-pay | Attending: Nurse Practitioner | Admitting: Nurse Practitioner

## 2017-12-24 VITALS — BP 108/72 | HR 80 | Temp 99.0°F | Ht 70.0 in | Wt 151.8 lb

## 2017-12-24 DIAGNOSIS — Z79899 Other long term (current) drug therapy: Secondary | ICD-10-CM | POA: Insufficient documentation

## 2017-12-24 DIAGNOSIS — F329 Major depressive disorder, single episode, unspecified: Secondary | ICD-10-CM | POA: Insufficient documentation

## 2017-12-24 DIAGNOSIS — Z8711 Personal history of peptic ulcer disease: Secondary | ICD-10-CM | POA: Insufficient documentation

## 2017-12-24 DIAGNOSIS — F419 Anxiety disorder, unspecified: Secondary | ICD-10-CM | POA: Insufficient documentation

## 2017-12-24 DIAGNOSIS — F32A Depression, unspecified: Secondary | ICD-10-CM

## 2017-12-24 DIAGNOSIS — K219 Gastro-esophageal reflux disease without esophagitis: Secondary | ICD-10-CM | POA: Insufficient documentation

## 2017-12-24 MED ORDER — FLUOXETINE HCL 20 MG PO TABS: 20.0000 mg | ORAL_TABLET | Freq: Every day | ORAL | 3 refills | Status: DC

## 2017-12-24 NOTE — Progress Notes (Signed)
Assessment & Plan:  Sean Park was seen today for establish care.  Diagnoses and all orders for this visit:  Gastroesophageal reflux disease, esophagitis presence not specified INSTRUCTIONS: Avoid GERD Triggers: acidic, spicy or fried foods, caffeine, coffee, sodas,  alcohol and chocolate.   Anxiety and depression -     FLUoxetine (PROZAC) 20 MG tablet; Take 1 tablet (20 mg total) by mouth daily.    Patient has been counseled on age-appropriate routine health concerns for screening and prevention. These are reviewed and up-to-date. Referrals have been placed accordingly. Immunizations are up-to-date or declined.    Subjective:   Chief Complaint  Patient presents with  . Establish Care    Pt. is here for a follow-up on his abdomen. P.t stated it is better and food are able to stay down.    HPI Sean Park 27 y.o. male presents to office today to establish care. He has a history of PUD, anxiety and depression. He has not been on omeprazole in over a year. I instructed him that despite being asymptomatic he should continue on PPI to avoid erosion and gastritis.   GERD Chronic. Symptoms include: Regurgitation, nausea, bloating, burning, "trapped gas".   He denies chest pain, choking on food, hematemesis, melena and wheezing. Symptoms have been present for several years. He denies dysphagia.  He has not lost weight. He denies melena, hematochezia, hematemesis, and coffee ground emesis. Medical therapy in the past has included antacids, H2 antagonists and proton pump inhibitors.  Anxiety and Depression The longest he has taken an SSRI was 3 months. He has been on Lexapro in the past. He took himself off of it because he did not like the way it made him feel. His mother passed in December 2018 and his father is currently in hospice. He endorses increased stressors and would like to try another SSRI today. He denies suicidal ideation.  Depression screen PHQ 2/9 12/24/2017  Decreased Interest 3   Down, Depressed, Hopeless 3  PHQ - 2 Score 6  Altered sleeping 2  Tired, decreased energy 2  Change in appetite 1  Feeling bad or failure about yourself  2  Trouble concentrating 1  Moving slowly or fidgety/restless 1  Suicidal thoughts 1  PHQ-9 Score 16  Some recent data might be hidden     Review of Systems  Constitutional: Negative for fever, malaise/fatigue and weight loss.  HENT: Negative.  Negative for nosebleeds.   Eyes: Negative.  Negative for blurred vision, double vision and photophobia.  Respiratory: Negative.  Negative for cough and shortness of breath.   Cardiovascular: Negative.  Negative for chest pain, palpitations and leg swelling.  Gastrointestinal: Positive for heartburn. Negative for nausea and vomiting.  Musculoskeletal: Negative.  Negative for myalgias.  Neurological: Negative.  Negative for dizziness, focal weakness, seizures and headaches.  Psychiatric/Behavioral: Positive for depression. Negative for suicidal ideas. The patient is nervous/anxious.     Past Medical History:  Diagnosis Date  . Anemia   . Anxiety   . Bronchitis   . Dysphagia   . GERD (gastroesophageal reflux disease)   . Muscle spasm   . Rotator cuff tear     Past Surgical History:  Procedure Laterality Date  . COLONOSCOPY      Family History  Problem Relation Age of Onset  . Diabetes Father   . Anxiety disorder Father   . Brain cancer Father   . Breast cancer Mother   . Crohn's disease Brother   . Schizophrenia Cousin   .  Depression Maternal Aunt   . Dementia Maternal Grandmother   . Colon cancer Neg Hx   . Esophageal cancer Neg Hx   . Stomach cancer Neg Hx   . Rectal cancer Neg Hx     Social History Reviewed with no changes to be made today.   Outpatient Medications Prior to Visit  Medication Sig Dispense Refill  . Multiple Vitamin (MULTIVITAMIN WITH MINERALS) TABS tablet Take 1 tablet by mouth daily.    . sucralfate (CARAFATE) 1 g tablet Take 1 tablet (1 g  total) by mouth 4 (four) times daily -  with meals and at bedtime. 120 tablet 0  . omeprazole (PRILOSEC) 40 MG capsule Take 1 capsule (40 mg total) by mouth daily. (Patient not taking: Reported on 12/24/2017) 30 capsule 3  . escitalopram (LEXAPRO) 10 MG tablet Take 1 tablet (10 mg total) by mouth daily. (Patient not taking: Reported on 12/24/2017) 30 tablet 3   No facility-administered medications prior to visit.     No Known Allergies     Objective:    BP 108/72 (BP Location: Left Arm, Patient Position: Sitting, Cuff Size: Normal)   Pulse 80   Temp 99 F (37.2 C) (Oral)   Ht 5\' 10"  (1.778 m)   Wt 151 lb 12.8 oz (68.9 kg)   SpO2 97%   BMI 21.78 kg/m  Wt Readings from Last 3 Encounters:  12/24/17 151 lb 12.8 oz (68.9 kg)  11/26/17 149 lb (67.6 kg)  11/14/17 153 lb (69.4 kg)    Physical Exam  Constitutional: He is oriented to person, place, and time. He appears well-developed and well-nourished. He is cooperative.  HENT:  Head: Normocephalic and atraumatic.  Eyes: EOM are normal.  Neck: Normal range of motion.  Cardiovascular: Normal rate, regular rhythm and normal heart sounds. Exam reveals no gallop and no friction rub.  No murmur heard. Pulmonary/Chest: Effort normal and breath sounds normal. No tachypnea. No respiratory distress. He has no decreased breath sounds. He has no wheezes. He has no rhonchi. He has no rales. He exhibits no tenderness.  Abdominal: Soft. Bowel sounds are normal.  Musculoskeletal: Normal range of motion. He exhibits no edema.  Neurological: He is alert and oriented to person, place, and time. Coordination normal.  Skin: Skin is warm and dry.  Psychiatric: He has a normal mood and affect. His behavior is normal. Judgment and thought content normal.  Nursing note and vitals reviewed.      Patient has been counseled extensively about nutrition and exercise as well as the importance of adherence with medications and regular follow-up. The patient was  given clear instructions to go to ER or return to medical center if symptoms don't improve, worsen or new problems develop. The patient verbalized understanding.   Follow-up: Return in about 8 weeks (around 02/18/2018) for f/u anxiety.   Claiborne Rigg, FNP-BC [facility] [place], [place] [phone]   12/24/2017, 3:59 PM

## 2017-12-24 NOTE — Patient Instructions (Signed)
Food Choices for Gastroesophageal Reflux Disease, Adult When you have gastroesophageal reflux disease (GERD), the foods you eat and your eating habits are very important. Choosing the right foods can help ease your discomfort. What guidelines do I need to follow?  Choose fruits, vegetables, whole grains, and low-fat dairy products.  Choose low-fat meat, fish, and poultry.  Limit fats such as oils, salad dressings, butter, nuts, and avocado.  Keep a food diary. This helps you identify foods that cause symptoms.  Avoid foods that cause symptoms. These may be different for everyone.  Eat small meals often instead of 3 large meals a day.  Eat your meals slowly, in a place where you are relaxed.  Limit fried foods.  Cook foods using methods other than frying.  Avoid drinking alcohol.  Avoid drinking large amounts of liquids with your meals.  Avoid bending over or lying down until 2-3 hours after eating. What foods are not recommended? These are some foods and drinks that may make your symptoms worse: Vegetables Tomatoes. Tomato juice. Tomato and spaghetti sauce. Chili peppers. Onion and garlic. Horseradish. Fruits Oranges, grapefruit, and lemon (fruit and juice). Meats High-fat meats, fish, and poultry. This includes hot dogs, ribs, ham, sausage, salami, and bacon. Dairy Whole milk and chocolate milk. Sour cream. Cream. Butter. Ice cream. Cream cheese. Drinks Coffee and tea. Bubbly (carbonated) drinks or energy drinks. Condiments Hot sauce. Barbecue sauce. Sweets/Desserts Chocolate and cocoa. Donuts. Peppermint and spearmint. Fats and Oils High-fat foods. This includes Jamaica[place] fries and potato chips. Other Vinegar. Strong spices. This includes black pepper, white pepper, red pepper, cayenne, curry powder, cloves, ginger, and chili powder. The items listed above may not be a complete list of foods and drinks to avoid. Contact your dietitian for more information. This  information is not intended to replace advice given to you by your health care provider. Make sure you discuss any questions you have with your health care provider. Document Released: 03/02/2012 Document Revised: 02/07/2016 Document Reviewed: 07/06/2013 Elsevier Interactive Patient Education  2017 [employer] Food Choices for Peptic Ulcer Disease When you have peptic ulcer disease, the foods you eat and your eating habits are very important. Choosing the right foods can help ease the discomfort of peptic ulcer disease. What general guidelines do I need to follow?  Choose fruits, vegetables, whole grains, and low-fat meat, fish, and poultry.  Keep a food diary to identify foods that cause symptoms.  Avoid foods that cause irritation or pain. These may be different for different people.  Eat frequent small meals instead of three large meals each day. The pain may be worse when your stomach is empty.  Avoid eating close to bedtime. What foods are not recommended? The following are some foods and drinks that may worsen your symptoms:  Black, white, and red pepper.  Hot sauce.  Chili peppers.  Chili powder.  Chocolate and cocoa.  Alcohol.  Tea, coffee, and cola (regular and decaffeinated).  The items listed above may not be a complete list of foods and beverages to avoid. Contact your dietitian for more information. This information is not intended to replace advice given to you by your health care provider. Make sure you discuss any questions you have with your health care provider. Document Released: 11/24/2011 Document Revised: 02/07/2016 Document Reviewed: 07/06/2013 Elsevier Interactive Patient Education  2017 [employer]

## 2018-01-01 ENCOUNTER — Emergency Department (HOSPITAL_COMMUNITY)
Admission: EM | Admit: 2018-01-01 | Discharge: 2018-01-02 | Disposition: A | Discharge status: Home / Self Care | Payer: Self-pay | Attending: Emergency Medicine | Admitting: Emergency Medicine

## 2018-01-01 ENCOUNTER — Other Ambulatory Visit: Payer: Self-pay

## 2018-01-01 ENCOUNTER — Encounter (HOSPITAL_COMMUNITY): Payer: Self-pay | Admitting: Emergency Medicine

## 2018-01-01 DIAGNOSIS — K5900 Constipation, unspecified: Secondary | ICD-10-CM | POA: Insufficient documentation

## 2018-01-01 DIAGNOSIS — Z87891 Personal history of nicotine dependence: Secondary | ICD-10-CM | POA: Insufficient documentation

## 2018-01-01 NOTE — ED Triage Notes (Signed)
Pt c/o Abd pain and thinks he is constipated. Pt had BM today but said he had to strain to get it out. Pt states that he has been drinking a lot of water and took a miralax today with no relief.

## 2018-01-02 ENCOUNTER — Emergency Department (HOSPITAL_COMMUNITY): Payer: Self-pay

## 2018-01-02 MED ORDER — FLEET ENEMA 7-19 GM/118ML RE ENEM: 1.0000 | ENEMA | Freq: Every day | RECTAL | 0 refills | Status: DC | PRN

## 2018-01-02 MED ORDER — DOCUSATE SODIUM 100 MG PO CAPS: 100.0000 mg | ORAL_CAPSULE | Freq: Two times a day (BID) | ORAL | 0 refills | Status: DC

## 2018-01-02 NOTE — ED Provider Notes (Signed)
[facility] Provider Note   CSN: [id number] Arrival date & time: 01/01/18  2218     History   Chief Complaint Chief Complaint  Patient presents with  . Constipation    HPI Sean Park is a 27 y.o. male.  Patient presents to the emergency department with chief complaint of constipation.  He reports having had difficulty with bowel movements for the past 2-3 days.  He reports having to strain.  He states that he has tried using MiraLAX today with no relief as well as drinking more water.  He states that he has associated abdominal pain and has had a small amount of vomiting today.  He denies any fevers chills.  He denies any other associated symptoms.  There are no aggravating factors.  The history is provided by the patient. No language interpreter was used.    Past Medical History:  Diagnosis Date  . Anemia   . Anxiety   . Bronchitis   . Dysphagia   . GERD (gastroesophageal reflux disease)   . Muscle spasm   . Rotator cuff tear     Patient Active Problem List   Diagnosis Date Noted  . Rotator cuff tendinitis, left 04/01/2017  . Scapular dyskinesis 04/01/2017  . Dyshidrotic eczema 02/05/2017  . Right hand pain 12/25/2016  . Impingement syndrome of left shoulder 04/13/2015  . Anxiety and depression 03/13/2015  . GERD (gastroesophageal reflux disease) 03/02/2015    Past Surgical History:  Procedure Laterality Date  . COLONOSCOPY          Home Medications    Prior to Admission medications   Medication Sig Start Date End Date Taking? Authorizing Provider  Multiple Vitamin (MULTIVITAMIN WITH MINERALS) TABS tablet Take 1 tablet by mouth daily.   Yes [provider]  FLUoxetine (PROZAC) 20 MG tablet Take 1 tablet (20 mg total) by mouth daily. Patient not taking: Reported on 01/02/2018 12/24/17   Claiborne RiggFleming, Zelda W, NP  omeprazole (PRILOSEC) 40 MG capsule Take 1 capsule (40 mg total) by mouth daily. Patient not taking:  Reported on 12/24/2017 12/10/17   Vivianne MasterNoel, Tiffany S, PA-C  sucralfate (CARAFATE) 1 g tablet Take 1 tablet (1 g total) by mouth 4 (four) times daily -  with meals and at bedtime. Patient not taking: Reported on 01/02/2018 11/14/17   Antony MaduraHumes, Kelly, PA-C    Family History Family History  Problem Relation Age of Onset  . Diabetes Father   . Anxiety disorder Father   . Brain cancer Father   . Breast cancer Mother   . Crohn's disease Brother   . Schizophrenia Cousin   . Depression Maternal Aunt   . Dementia Maternal Grandmother   . Colon cancer Neg Hx   . Esophageal cancer Neg Hx   . Stomach cancer Neg Hx   . Rectal cancer Neg Hx     Social History Social History   Tobacco Use  . Smoking status: Former Smoker    Types: Cigarettes    Last attempt to quit: 09/15/2014    Years since quitting: 3.3  . Smokeless tobacco: Never Used  . Tobacco comment: Smoke Marijuana   Substance Use Topics  . Alcohol use: Not Currently    Alcohol/week: 0.0 oz    Comment: stop for a month 1/2  . Drug use: Yes    Types: Marijuana     Allergies   Patient has no known allergies.   Review of Systems Review of Systems  All other systems  reviewed and are negative.    Physical Exam Updated Vital Signs BP 117/78 (BP Location: Right Arm)   Pulse (!) 56   Temp 98.1 F (36.7 C) (Oral)   Resp 16   Wt 68.5 kg (151 lb)   SpO2 100%   BMI 21.67 kg/m   Physical Exam  Constitutional: He is oriented to person, place, and time. No distress.  HENT:  Head: Normocephalic and atraumatic.  Eyes: Pupils are equal, round, and reactive to light. Conjunctivae and EOM are normal.  Neck: No tracheal deviation present.  Cardiovascular: Normal rate.  Pulmonary/Chest: Effort normal. No respiratory distress.  Abdominal: Soft. Bowel sounds are normal. There is no tenderness.  Normal active bowel sounds, no focal tenderness  Musculoskeletal: Normal range of motion.  Neurological: He is alert and oriented to person,  place, and time.  Skin: Skin is warm and dry. He is not diaphoretic.  Psychiatric: Judgment normal.  Nursing note and vitals reviewed.    ED Treatments / Results  Labs (all labs ordered are listed, but only abnormal results are displayed) Labs Reviewed - No data to display  EKG None  Radiology No results found.  Procedures Procedures (including critical care time)  Medications Ordered in ED Medications - No data to display   Initial Impression / Assessment and Plan / ED Course  I have reviewed the triage vital signs and the nursing notes.  Pertinent labs & imaging results that were available during my care of the patient were reviewed by me and considered in my medical decision making (see chart for details).     Patient with generalized abdominal pain times 2-3 days.  Reports decreased bowel movements.  Believes she is constipated.  Differential diagnosis includes, but is not limited to constipation, intestinal obstruction, intra-abdominal infection.  Will check abdominal series plain films to rule out air-fluid levels.  Patient is nontoxic appearing.  He has no focal abdominal tenderness on exam.  Anticipate discharge to home with enemas and Colace with instructions to continue MiraLAX.  No evidence of small bowel obstruction or air-fluid levels seen on plain films.  Will discharge home with enema and Colace.  Final Clinical Impressions(s) / ED Diagnoses   Final diagnoses:  Constipation, unspecified constipation type    ED Discharge Orders    None       Roxy Horseman, PA-C 01/02/18 1610    Devoria Albe, MD 01/02/18 0530

## 2018-02-19 ENCOUNTER — Ambulatory Visit: Payer: Self-pay | Attending: Nurse Practitioner | Admitting: Nurse Practitioner

## 2018-02-19 ENCOUNTER — Encounter: Payer: Self-pay | Admitting: Nurse Practitioner

## 2018-02-19 VITALS — BP 100/61 | HR 68 | Temp 98.1°F | Resp 18 | Ht 71.0 in | Wt 151.0 lb

## 2018-02-19 DIAGNOSIS — F32A Depression, unspecified: Secondary | ICD-10-CM

## 2018-02-19 DIAGNOSIS — R7989 Other specified abnormal findings of blood chemistry: Secondary | ICD-10-CM

## 2018-02-19 DIAGNOSIS — K219 Gastro-esophageal reflux disease without esophagitis: Secondary | ICD-10-CM | POA: Insufficient documentation

## 2018-02-19 DIAGNOSIS — F329 Major depressive disorder, single episode, unspecified: Secondary | ICD-10-CM | POA: Insufficient documentation

## 2018-02-19 DIAGNOSIS — Z79899 Other long term (current) drug therapy: Secondary | ICD-10-CM | POA: Insufficient documentation

## 2018-02-19 DIAGNOSIS — F419 Anxiety disorder, unspecified: Secondary | ICD-10-CM | POA: Insufficient documentation

## 2018-02-19 MED ORDER — OMEPRAZOLE 40 MG PO CPDR
40.0000 mg | DELAYED_RELEASE_CAPSULE | Freq: Every day | ORAL | 3 refills | Status: DC
Start: 2018-02-19 — End: 2019-03-02

## 2018-02-19 MED ORDER — OMEPRAZOLE 40 MG PO CPDR: 40.0000 mg | DELAYED_RELEASE_CAPSULE | Freq: Every day | ORAL | 3 refills | Status: DC

## 2018-02-19 NOTE — Progress Notes (Signed)
Assessment & Plan:  Sean Park was seen today for follow-up.  Diagnoses and all orders for this visit:  Anxiety and depression Take prozac as prescribed and do not skip doses.   Abnormal CBC -     CBC  Gastroesophageal reflux disease without esophagitis Chronic. Last CBC slightly abnormal with history of PUD. Patient unsure if history of bleeding ulcer.  -     omeprazole (PRILOSEC) 40 MG capsule; Take 1 capsule (40 mg total) by mouth daily. INSTRUCTIONS: Avoid GERD Triggers: acidic, spicy or fried foods, caffeine, coffee, sodas,  alcohol and chocolate.   Patient has been counseled on age-appropriate routine health concerns for screening and prevention. These are reviewed and up-to-date. Referrals have been placed accordingly. Immunizations are up-to-date or declined.    Subjective:   Chief Complaint  Patient presents with  . Follow-up    anxiety   HPI Sean Park 27 y.o. male presents to office today for anxiety. He was planning to get married   Anxiety Started on prozac at his last office visit with me 12-24-2017. He has not been consistent with taking it. States the longest time frame he has taken prozac was 3 weeks. He does endorse ruminating thoughts of harming himself however not currently. He has no specific plan when these thoughts do occur. I have encouraged him to go to grief counseling as he lost his mother only several months ago and his father is still in hospice. He was also given grief counseling pamphlet today in the office. His PHQ9 score has slightly increased.  Depression screen PHQ 2/9 02/19/2018  Decreased Interest 2  Down, Depressed, Hopeless 3  PHQ - 2 Score 5  Altered sleeping 2  Tired, decreased energy 2  Change in appetite 2  Feeling bad or failure about yourself  2  Trouble concentrating 2  Moving slowly or fidgety/restless 1  Suicidal thoughts 1  PHQ-9 Score 17  Some recent data might be hidden      Review of Systems  Constitutional: Negative  for fever, malaise/fatigue and weight loss.  HENT: Negative.  Negative for nosebleeds.   Eyes: Negative.  Negative for blurred vision, double vision and photophobia.  Respiratory: Negative.  Negative for cough and shortness of breath.   Cardiovascular: Negative.  Negative for chest pain, palpitations and leg swelling.  Gastrointestinal: Positive for heartburn. Negative for nausea and vomiting.       History of PUD  Musculoskeletal: Negative.  Negative for myalgias.  Neurological: Negative.  Negative for dizziness, focal weakness, seizures and headaches.  Psychiatric/Behavioral: Positive for depression. Negative for suicidal ideas. The patient is nervous/anxious.     Past Medical History:  Diagnosis Date  . Anemia   . Anxiety   . Bronchitis   . Dysphagia   . GERD (gastroesophageal reflux disease)   . Muscle spasm   . Rotator cuff tear     Past Surgical History:  Procedure Laterality Date  . COLONOSCOPY      Family History  Problem Relation Age of Onset  . Diabetes Father   . Anxiety disorder Father   . Brain cancer Father   . Breast cancer Mother   . Crohn's disease Brother   . Schizophrenia Cousin   . Depression Maternal Aunt   . Dementia Maternal Grandmother   . Colon cancer Neg Hx   . Esophageal cancer Neg Hx   . Stomach cancer Neg Hx   . Rectal cancer Neg Hx     Social History Reviewed with no  changes to be made today.   Outpatient Medications Prior to Visit  Medication Sig Dispense Refill  . docusate sodium (COLACE) 100 MG capsule Take 1 capsule (100 mg total) by mouth every 12 (twelve) hours. 30 capsule 0  . FLUoxetine (PROZAC) 20 MG tablet Take 1 tablet (20 mg total) by mouth daily. 30 tablet 3  . Multiple Vitamin (MULTIVITAMIN WITH MINERALS) TABS tablet Take 1 tablet by mouth daily.    . sodium phosphate (FLEET) 7-19 GM/118ML ENEM Place 133 mLs (1 enema total) rectally daily as needed for severe constipation. 5 enema 0  . omeprazole (PRILOSEC) 40 MG capsule  Take 1 capsule (40 mg total) by mouth daily. (Patient not taking: Reported on 12/24/2017) 30 capsule 3  . sucralfate (CARAFATE) 1 g tablet Take 1 tablet (1 g total) by mouth 4 (four) times daily -  with meals and at bedtime. (Patient not taking: Reported on 01/02/2018) 120 tablet 0   No facility-administered medications prior to visit.     No Known Allergies     Objective:    BP 100/61 (BP Location: Left Arm, Patient Position: Sitting, Cuff Size: Normal)   Pulse 68   Temp 98.1 F (36.7 C) (Oral)   Resp 18   Ht 5\' 11"  (1.803 m)   Wt 151 lb (68.5 kg)   SpO2 100%   BMI 21.06 kg/m  Wt Readings from Last 3 Encounters:  02/19/18 151 lb (68.5 kg)  01/01/18 151 lb (68.5 kg)  12/24/17 151 lb 12.8 oz (68.9 kg)    Physical Exam  Constitutional: He is oriented to person, place, and time. He appears well-developed and well-nourished. He is cooperative.  HENT:  Head: Normocephalic and atraumatic.  Eyes: EOM are normal.  Neck: Normal range of motion.  Cardiovascular: Normal rate, regular rhythm and normal heart sounds. Exam reveals no gallop and no friction rub.  No murmur heard. Pulmonary/Chest: Breath sounds normal. No tachypnea. No respiratory distress. He has no decreased breath sounds. He has no wheezes. He has no rhonchi. He has no rales. He exhibits no tenderness.  Abdominal: Soft. Bowel sounds are normal.  Musculoskeletal: Normal range of motion. He exhibits no edema.  Neurological: He is alert and oriented to person, place, and time. Coordination normal.  Skin: Skin is warm and dry.  Psychiatric: His speech is normal and behavior is normal. Judgment and thought content normal. Cognition and memory are normal. He exhibits a depressed mood.  Nursing note and vitals reviewed.      Patient has been counseled extensively about nutrition and exercise as well as the importance of adherence with medications and regular follow-up. The patient was given clear instructions to go to ER or  return to medical center if symptoms don't improve, worsen or new problems develop. The patient verbalized understanding.   Follow-up: Return in about 2 months (around 04/21/2018).   Claiborne RiggZelda W Drema Eddington, FNP-BC [facility] [place], [place] [phone]   02/19/2018, 1:19 PM

## 2018-02-19 NOTE — Patient Instructions (Signed)
Coping With Loss, Adult People experience loss in many different ways throughout their lives. Events such as moving, changing jobs, and losing friends can create a sense of loss. The loss may be as serious as a major health change, divorce, death of a pet, or death of a loved one. All of these types of loss are likely to create a physical and emotional reaction known as grief. Grief is the result of a major change or an absence of something or someone that you count on. Grief is a normal reaction to loss. How to recognize changes A variety of factors can affect your grieving experience, including:  The nature of your loss.  Your relationship to what or whom you lost.  Your understanding of grief and how to cope with it.  Your support system.  The way that you deal with your grief will affect your ability to function as you normally do. When you are grieving, you may experience:  Numbness, shock, sadness, anxiety, anger, denial, and guilt.  Thoughts about death.  Unexpected crying.  A physical sensation of emptiness in your gut.  Problems sleeping and eating.  Fatigue.  Loss of interest in normal activities.  Dreaming about or imagining seeing the person who died.  A need to remember what or whom you lost.  Difficulty thinking about anything other than your loss for a period of time.  Relief. If you have been expecting the loss for a while, you may feel a sense of relief when it happens.  Where to find support To get support for coping with loss:  Ask your health care provider for help and recommendations, such as grief counseling or therapy.  Think about joining a support group for people who are coping with loss.  Follow these instructions at home:  Be patient with yourself and others. Allow the grieving process to happen, and remember that grieving takes time. ? It is likely that you may never feel completely done with some grief. You may find a way to move on while  still cherishing memories and feelings about your loss. ? Accepting your loss is a process. It can take months or longer to adjust.  Express your feelings in healthy ways, such as: ? Talking with others about your loss. It may be helpful to find others who have had a similar loss, such as a support group. ? Writing down your feelings in a journal. ? Doing physical activities to release stress and emotional energy. ? Doing creative activities like painting, sculpting, or playing or listening to music. ? Practicing resilience. This is the ability to recover and adjust after facing challenges. Reading some resources that encourage resilience may help you to learn ways to practice those behaviors.  Keep to your normal routine as much as possible. If you have trouble focusing or doing normal activities, it is acceptable to take some time away from your normal routine.  Spend time with friends and loved ones.  Eat a healthy diet, get plenty of sleep, and rest when you feel tired. Where to find more information: You can find more information about coping with loss from:  American Society of Clinical Oncology: www.cancer.net  American Psychological Association: www.apa.org  Contact a health care provider if:  Your grief is extreme and keeps getting worse.  You have ongoing grief that does not improve.  Your body shows symptoms of grief, such as illness.  You feel depressed, anxious, or lonely. Get help right away if:  You have   thoughts about hurting yourself or others. If you ever feel like you may hurt yourself or others, or have thoughts about taking your own life, get help right away. You can go to your nearest emergency department or call:  Your local emergency services (911 in the [place]  A suicide crisis helpline, such as the National Suicide Prevention Lifeline at 951-502-38691-803-497-7260. This is open 24 hours a day.  Summary  Grief is a normal part of experiencing a loss. It is the  result of a major change or an absence of something or someone that you count on.  The depth of grief and the period of recovery depend on the type of loss as well as your ability to adjust to the change and process your feelings.  Processing grief requires patience and a willingness to accept your feelings and talk about your loss with people who are supportive.  It is important to find resources that work for you and to realize that we are all different when it comes to grief. There is not one single grieving process that works for everyone in the same way.  Be aware that when grief becomes extreme, it can lead to more severe issues like isolation, depression, anxiety, or suicidal thoughts. Talk with your health care provider if you have any of these issues. This information is not intended to replace advice given to you by your health care provider. Make sure you discuss any questions you have with your health care provider. Document Released: 01/15/2017 Document Revised: 01/15/2017 Document Reviewed: 01/15/2017 Elsevier Interactive Patient Education  2018 [employer]  Complicated Grieving Grief is a normal response to the death of someone close to you. Feelings of fear, anger, and guilt can affect almost everyone who loses a loved one. It is also common to have symptoms of depression while you are grieving. These include problems with sleep, loss of appetite, and lack of energy. They may last for weeks or months after a loss. Complicated grief is different from normal grief or depression. Normal grieving involves sadness and feelings of loss, but these feelings are not constant. Complicated grief is a constant and severe type of grief. It interferes with your ability to function normally. It may last for several months to a year or longer. Complicated grief may require treatment from a mental health care provider. What are the causes? It is not known why some people continue to struggle with  grief and others do not. You may be at higher risk for complicated grief if:  The death of your loved one was sudden or unexpected.  The death of your loved one was due to a violent event.  Your loved one committed suicide.  Your loved one was a child or a young person.  You were very close to or dependent on the loved one.  You have a history of depression.  What are the signs or symptoms? Signs and symptoms of complicated grief may include:  Feeling disbelief or numbness.  Being unable to enjoy good memories of your loved one.  Needing to avoid anything that reminds you of your loved one.  Being unable to stop thinking about the death.  Feeling intense anger or guilt.  Feeling alone and hopeless.  Feeling that your life is meaningless and empty.  Losing the desire to live.  How is this diagnosed? Your health care provider may diagnose complicated grief if:  You have constant symptoms of grief for 6-12 months or longer.  Your symptoms  are interfering with your ability to live your life.  Your health care provider may want you to see a mental health care provider. Many symptoms of depression are similar to the symptoms of complicated grief. It is important to be evaluated for complicated grief along with other mental health conditions. How is this treated? Talk therapy with a mental health provider is the most common treatment for complicated grief. During therapy, you will learn healthy ways to cope with the loss of your loved one. In some cases, your mental health care provider may also recommend antidepressant medicines. Follow these instructions at home:  Take care of yourself. ? Eat regular meals and maintain a healthy diet. Eat plenty of fruits, vegetables, and whole grains. ? Try to get some exercise each day. ? Keep regular hours for sleep. Try to get at least 8 hours of sleep each night.  Do not use drugs or alcohol to ease your symptoms.  Take medicines  only as directed by your health care provider.  Spend time with friends and loved ones.  Consider joining a grief (bereavement) support group to help you deal with your loss.  Keep all follow-up visits as directed by your health care provider. This is important. Contact a health care provider if:  Your symptoms keep you from functioning normally.  Your symptoms do not get better with treatment. Get help right away if:  You have serious thoughts of hurting yourself or someone else.  You have suicidal feelings. This information is not intended to replace advice given to you by your health care provider. Make sure you discuss any questions you have with your health care provider. Document Released: 09/01/2005 Document Revised: 02/07/2016 Document Reviewed: 02/09/2014 Elsevier Interactive Patient Education  [employer].

## 2018-02-20 LAB — CBC
HEMATOCRIT: 43.2 % (ref 37.5–51.0)
HEMOGLOBIN: 13.1 g/dL (ref 13.0–17.7)
MCH: 23.6 pg — ABNORMAL LOW (ref 26.6–33.0)
MCHC: 30.3 g/dL — ABNORMAL LOW (ref 31.5–35.7)
MCV: 78 fL — ABNORMAL LOW (ref 79–97)
Platelets: 210 10*3/uL (ref 150–450)
RBC: 5.56 x10E6/uL (ref 4.14–5.80)
RDW: 14.2 % (ref 12.3–15.4)
WBC: 4.8 10*3/uL (ref 3.4–10.8)

## 2018-02-24 ENCOUNTER — Telehealth: Payer: Self-pay

## 2018-02-24 NOTE — Telephone Encounter (Signed)
CMA spoke to patient to inform on lab results.  Patient verified DOB. Patient understood.  

## 2018-02-24 NOTE — Telephone Encounter (Signed)
-----   Message from Claiborne RiggZelda W Fleming, NP sent at 02/23/2018  1:20 PM EDT ----- Labs for mild anemia are stable at this time. Will continue to monitor. No workup recommended at this time.

## 2018-04-26 ENCOUNTER — Encounter: Payer: Self-pay | Admitting: Nurse Practitioner

## 2018-04-26 ENCOUNTER — Ambulatory Visit: Payer: Self-pay | Attending: Nurse Practitioner | Admitting: Nurse Practitioner

## 2018-04-26 VITALS — BP 118/67 | HR 58 | Temp 98.8°F | Ht 71.0 in | Wt 148.8 lb

## 2018-04-26 DIAGNOSIS — N509 Disorder of male genital organs, unspecified: Secondary | ICD-10-CM

## 2018-04-26 DIAGNOSIS — M779 Enthesopathy, unspecified: Secondary | ICD-10-CM | POA: Insufficient documentation

## 2018-04-26 DIAGNOSIS — K219 Gastro-esophageal reflux disease without esophagitis: Secondary | ICD-10-CM | POA: Insufficient documentation

## 2018-04-26 DIAGNOSIS — Z79899 Other long term (current) drug therapy: Secondary | ICD-10-CM | POA: Insufficient documentation

## 2018-04-26 DIAGNOSIS — M7582 Other shoulder lesions, left shoulder: Secondary | ICD-10-CM

## 2018-04-26 DIAGNOSIS — M25512 Pain in left shoulder: Secondary | ICD-10-CM | POA: Insufficient documentation

## 2018-04-26 DIAGNOSIS — M75102 Unspecified rotator cuff tear or rupture of left shoulder, not specified as traumatic: Secondary | ICD-10-CM | POA: Insufficient documentation

## 2018-04-26 DIAGNOSIS — N5089 Other specified disorders of the male genital organs: Secondary | ICD-10-CM | POA: Insufficient documentation

## 2018-04-26 DIAGNOSIS — F419 Anxiety disorder, unspecified: Secondary | ICD-10-CM | POA: Insufficient documentation

## 2018-04-26 NOTE — Progress Notes (Signed)
Assessment & Plan:  Sean Park was seen today for follow-up.  Diagnoses and all orders for this visit:  Rotator cuff tendinitis, left Continue home exercises and NSAIDs Needs additional imaging and referral however can not afford an MRI or OOP costs to specialist. Unable to prescribe Tramadol or Tylenol #3 as he smokes marijuan  Testicular abnormality Declines US due to OOP costs He has been advised to sign up for the financial assistance program since last year as well as today.    Patient has been counseled on age-appropriate routine health concerns for screening and prevention. These are reviewed and up-to-date. Referrals have been placed accordingly. Immunizations are up-to-date or declined.    Subjective:   Chief Complaint  Patient presents with  . Follow-up    Pt. is here to follow-up. Pt. stated his left shoulder is giving him pain due to his last year shoulder cuff rotator tear.    HPI [name] 27 y.o. male presents to office today with complaints of left shoulder pain and enlarged left testicle.    Left Shoulder Pain He has a history of left rotator cuff tendinitis. He has been evaluated by sports medicine in the past (04-2017) and was prescribed naproxen and meloxicam in the past and home exercises were recommended with reported minimal relief of pain. He was also a candidate for subacromial steroid injections but was self pay at the time and could not afford the out of pocket cost. Today he endorses increased left shoulder pain however he is still self pay and had not applied for the financial assistance program. Patient has been advised to apply for financial assistance and schedule to see our financial counselor.    Swollen Testicle Patient states he noticed that his left testicle is noticeably larger than the right testicle. He is not certain how long his left testicle has been larger but states he just noticed it a few weeks ago and decided to mention it today. He  denies any GU symptoms such as dysuria, hesitancy, urgency or frequency. He also denies any STI symptoms such as penile drainage, lesions, blisters, or discharge.    Review of Systems  Constitutional: Negative for fever, malaise/fatigue and weight loss.  HENT: Negative.  Negative for nosebleeds.   Eyes: Negative.  Negative for blurred vision, double vision and photophobia.  Respiratory: Negative.  Negative for cough and shortness of breath.   Cardiovascular: Negative.  Negative for chest pain, palpitations and leg swelling.  Gastrointestinal: Positive for heartburn. Negative for nausea and vomiting.  Genitourinary:       SEE HPI  Musculoskeletal: Positive for joint pain. Negative for myalgias.       SEE HPI  Neurological: Negative.  Negative for dizziness, focal weakness, seizures and headaches.  Psychiatric/Behavioral: Negative.  Negative for suicidal ideas.    Past Medical History:  Diagnosis Date  . Anemia   . Anxiety   . Bronchitis   . Dysphagia   . GERD (gastroesophageal reflux disease)   . Muscle spasm   . Rotator cuff tear     Past Surgical History:  Procedure Laterality Date  . COLONOSCOPY      Family History  Problem Relation Age of Onset  . Diabetes Father   . Anxiety disorder Father   . Brain cancer Father   . Breast cancer Mother   . Crohn's disease Brother   . Schizophrenia Cousin   . Depression Maternal Aunt   . Dementia Maternal Grandmother   . Colon cancer Neg Hx   .  Esophageal cancer Neg Hx   . Stomach cancer Neg Hx   . Rectal cancer Neg Hx     Social History Reviewed with no changes to be made today.   Outpatient Medications Prior to Visit  Medication Sig Dispense Refill  . omeprazole (PRILOSEC) 40 MG capsule Take 1 capsule (40 mg total) by mouth daily. 90 capsule 3  . Multiple Vitamin (MULTIVITAMIN WITH MINERALS) TABS tablet Take 1 tablet by mouth daily.    . sodium phosphate (FLEET) 7-19 GM/118ML ENEM Place 133 mLs (1 enema total) rectally  daily as needed for severe constipation. (Patient not taking: Reported on 04/26/2018) 5 enema 0  . docusate sodium (COLACE) 100 MG capsule Take 1 capsule (100 mg total) by mouth every 12 (twelve) hours. (Patient not taking: Reported on 04/26/2018) 30 capsule 0  . FLUoxetine (PROZAC) 20 MG tablet Take 1 tablet (20 mg total) by mouth daily. (Patient not taking: Reported on 04/26/2018) 30 tablet 3   No facility-administered medications prior to visit.     No Known Allergies     Objective:    BP 118/67 (BP Location: Left Arm, Patient Position: Sitting, Cuff Size: Normal)   Pulse (!) 58   Temp 98.8 F (37.1 C) (Oral)   Ht 5\' 11"  (1.803 m)   Wt 148 lb 12.8 oz (67.5 kg)   SpO2 100%   BMI 20.75 kg/m  Wt Readings from Last 3 Encounters:  04/26/18 148 lb 12.8 oz (67.5 kg)  02/19/18 151 lb (68.5 kg)  01/01/18 151 lb (68.5 kg)    Physical Exam  Constitutional: He is oriented to person, place, and time. He appears well-developed and well-nourished. He is cooperative.  HENT:  Head: Normocephalic and atraumatic.  Cardiovascular: Normal rate, regular rhythm, normal heart sounds and intact distal pulses. Exam reveals no gallop and no friction rub.  No murmur heard. Pulmonary/Chest: Effort normal and breath sounds normal. No tachypnea. No respiratory distress. He has no decreased breath sounds. He has no wheezes. He has no rhonchi. He has no rales. He exhibits no tenderness.  Abdominal: Soft. Bowel sounds are normal. He exhibits no distension and no mass. There is no tenderness. There is no rebound and no guarding. No hernia. Hernia confirmed negative in the right inguinal area and confirmed negative in the left inguinal area.  Genitourinary: Penis normal. Right testis shows no mass, no swelling and no tenderness. Right testis is descended. Cremasteric reflex is not absent on the right side. Left testis shows swelling (left testicle is noticeably larger than right testicle. There is no tenderness or  pain present. I am unable to appreciate any palpable masses. ). Left testis shows no mass and no tenderness. Left testis is descended. Cremasteric reflex is not absent on the left side. No phimosis, paraphimosis, hypospadias, penile erythema or penile tenderness. No discharge found.  Musculoskeletal: He exhibits no edema.       Left shoulder: He exhibits decreased range of motion (Unable to actively raise arm past shoulder height without eliciting pain), tenderness and pain. He exhibits no swelling.  Lymphadenopathy: No inguinal adenopathy noted on the right or left side.  Neurological: He is alert and oriented to person, place, and time. Coordination normal.  Skin: Skin is warm and dry.  Psychiatric: He has a normal mood and affect. His behavior is normal. Judgment and thought content normal.  Nursing note and vitals reviewed.     Patient has been counseled extensively about nutrition and exercise as well as the importance of  adherence with medications and regular follow-up. The patient was given clear instructions to go to ER or return to medical center if symptoms don't improve, worsen or new problems develop. The patient verbalized understanding.   Follow-up: Return in about 6 weeks (around 06/07/2018) for physical .   Claiborne RiggZelda W Derrica Sieg, FNP-BC [facility] [place], [place] [phone]   05/01/2018, 9:23 PM

## 2018-05-01 ENCOUNTER — Encounter: Payer: Self-pay | Admitting: Nurse Practitioner

## 2018-05-26 ENCOUNTER — Ambulatory Visit: Payer: Self-pay | Attending: Nurse Practitioner

## 2018-06-07 ENCOUNTER — Encounter: Payer: Self-pay | Admitting: Nurse Practitioner

## 2018-06-07 ENCOUNTER — Ambulatory Visit: Payer: Self-pay | Attending: Nurse Practitioner | Admitting: Nurse Practitioner

## 2018-06-07 VITALS — BP 115/73 | HR 62 | Temp 98.3°F | Ht 71.0 in | Wt 147.0 lb

## 2018-06-07 DIAGNOSIS — Z8379 Family history of other diseases of the digestive system: Secondary | ICD-10-CM | POA: Insufficient documentation

## 2018-06-07 DIAGNOSIS — Z808 Family history of malignant neoplasm of other organs or systems: Secondary | ICD-10-CM | POA: Insufficient documentation

## 2018-06-07 DIAGNOSIS — K219 Gastro-esophageal reflux disease without esophagitis: Secondary | ICD-10-CM | POA: Insufficient documentation

## 2018-06-07 DIAGNOSIS — Z0000 Encounter for general adult medical examination without abnormal findings: Secondary | ICD-10-CM

## 2018-06-07 DIAGNOSIS — Z79899 Other long term (current) drug therapy: Secondary | ICD-10-CM | POA: Insufficient documentation

## 2018-06-07 DIAGNOSIS — Z9889 Other specified postprocedural states: Secondary | ICD-10-CM | POA: Insufficient documentation

## 2018-06-07 DIAGNOSIS — R0989 Other specified symptoms and signs involving the circulatory and respiratory systems: Secondary | ICD-10-CM | POA: Insufficient documentation

## 2018-06-07 DIAGNOSIS — Z0001 Encounter for general adult medical examination with abnormal findings: Secondary | ICD-10-CM | POA: Insufficient documentation

## 2018-06-07 DIAGNOSIS — Z803 Family history of malignant neoplasm of breast: Secondary | ICD-10-CM | POA: Insufficient documentation

## 2018-06-07 DIAGNOSIS — F419 Anxiety disorder, unspecified: Secondary | ICD-10-CM | POA: Insufficient documentation

## 2018-06-07 DIAGNOSIS — Z833 Family history of diabetes mellitus: Secondary | ICD-10-CM | POA: Insufficient documentation

## 2018-06-07 DIAGNOSIS — Z818 Family history of other mental and behavioral disorders: Secondary | ICD-10-CM | POA: Insufficient documentation

## 2018-06-07 NOTE — Progress Notes (Signed)
Assessment & Plan:  Sean Park was seen today for annual exam.  Diagnoses and all orders for this visit:  Encounter for annual physical exam  Abnormal lung sounds -     DG Chest 2 View; Future    Patient has been counseled on age-appropriate routine health concerns for screening and prevention. These are reviewed and up-to-date. Referrals have been placed accordingly. Immunizations are up-to-date or declined.    Subjective:   Chief Complaint  Patient presents with  . Annual Exam    Pt. is here for a physical.    HPI Sean Park 27 y.o. male presents to office today for annual physical exam. Patient has been advised to apply for financial assistance and schedule to see our financial counselor.   Review of Systems  Constitutional: Negative for fever, malaise/fatigue and weight loss.  HENT: Negative.  Negative for nosebleeds.   Eyes: Negative.  Negative for blurred vision, double vision and photophobia.  Respiratory: Negative.  Negative for cough and shortness of breath.   Cardiovascular: Negative.  Negative for chest pain, palpitations and leg swelling.  Gastrointestinal: Negative.  Negative for heartburn, nausea and vomiting.  Genitourinary: Negative for dysuria, flank pain, frequency, hematuria and urgency.       Enlarged left testicle  Musculoskeletal: Positive for joint pain (left shoulder pain). Negative for myalgias.  Skin: Negative.   Neurological: Negative.  Negative for dizziness, focal weakness, seizures and headaches.  Endo/Heme/Allergies: Negative.   Psychiatric/Behavioral: Positive for depression. Negative for suicidal ideas.    Past Medical History:  Diagnosis Date  . Anemia   . Anxiety   . Bronchitis   . Dysphagia   . GERD (gastroesophageal reflux disease)   . Muscle spasm   . Rotator cuff tear     Past Surgical History:  Procedure Laterality Date  . COLONOSCOPY      Family History  Problem Relation Age of Onset  . Diabetes Father   . Anxiety  disorder Father   . Brain cancer Father   . Breast cancer Mother   . Crohn's disease Brother   . Schizophrenia Cousin   . Depression Maternal Aunt   . Dementia Maternal Grandmother   . Colon cancer Neg Hx   . Esophageal cancer Neg Hx   . Stomach cancer Neg Hx   . Rectal cancer Neg Hx     Social History Reviewed with no changes to be made today.   Outpatient Medications Prior to Visit  Medication Sig Dispense Refill  . Multiple Vitamin (MULTIVITAMIN WITH MINERALS) TABS tablet Take 1 tablet by mouth daily.    Marland Kitchen. omeprazole (PRILOSEC) 40 MG capsule Take 1 capsule (40 mg total) by mouth daily. (Patient not taking: Reported on 06/07/2018) 90 capsule 3  . sodium phosphate (FLEET) 7-19 GM/118ML ENEM Place 133 mLs (1 enema total) rectally daily as needed for severe constipation. (Patient not taking: Reported on 04/26/2018) 5 enema 0   No facility-administered medications prior to visit.     No Known Allergies     Objective:    BP 115/73 (BP Location: Right Arm, Patient Position: Sitting, Cuff Size: Normal)   Pulse 62   Temp 98.3 F (36.8 C) (Oral)   Ht 5\' 11"  (1.803 m)   Wt 147 lb (66.7 kg)   SpO2 100%   BMI 20.50 kg/m  Wt Readings from Last 3 Encounters:  06/07/18 147 lb (66.7 kg)  04/26/18 148 lb 12.8 oz (67.5 kg)  02/19/18 151 lb (68.5 kg)    Physical  Exam  Constitutional: He is oriented to person, place, and time. He appears well-developed and well-nourished.  HENT:  Head: Normocephalic and atraumatic.  Right Ear: Hearing, tympanic membrane, external ear and ear canal normal.  Left Ear: Hearing, tympanic membrane, external ear and ear canal normal.  Nose: Nose normal. No mucosal edema or rhinorrhea.  Mouth/Throat: Uvula is midline, oropharynx is clear and moist and mucous membranes are normal. Tonsils are 1+ on the right. Tonsils are 1+ on the left. No tonsillar exudate.  Eyes: Pupils are equal, round, and reactive to light. Conjunctivae, EOM and lids are normal. No  scleral icterus.  Neck: Normal range of motion. Neck supple. No tracheal deviation present. No thyromegaly present.  Cardiovascular: Normal rate, regular rhythm, normal heart sounds and intact distal pulses. Exam reveals no gallop and no friction rub.  No murmur heard. Pulmonary/Chest: Effort normal. No respiratory distress. He has decreased breath sounds in the left upper field, the left middle field and the left lower field. He has no wheezes. He has no rales. He exhibits no mass and no tenderness. Right breast exhibits no inverted nipple, no mass, no nipple discharge, no skin change and no tenderness. Left breast exhibits no inverted nipple, no mass, no nipple discharge, no skin change and no tenderness.  Abdominal: Soft. Bowel sounds are normal. He exhibits no distension and no mass. There is no tenderness. There is no rebound and no guarding. Hernia confirmed negative in the right inguinal area and confirmed negative in the left inguinal area.  Genitourinary: Penis normal. Right testis shows no mass, no swelling and no tenderness. Right testis is descended. Cremasteric reflex is not absent on the right side. Left testis shows swelling. Left testis shows no mass and no tenderness. Left testis is descended. Cremasteric reflex is not absent on the left side.  Musculoskeletal: He exhibits no edema or deformity.       Left shoulder: He exhibits decreased range of motion, tenderness and pain.  There is moderate pain elicited in left shoulder with shoulder shrug test  Lymphadenopathy:    He has no cervical adenopathy. No inguinal adenopathy noted on the right or left side.       Right: No inguinal adenopathy present.       Left: No inguinal adenopathy present.  Neurological: He is alert and oriented to person, place, and time. He displays normal reflexes. No cranial nerve deficit. He exhibits normal muscle tone. Coordination normal.  Skin: Skin is warm and dry. Capillary refill takes less than 2  seconds. No erythema.  Psychiatric: He has a normal mood and affect. His behavior is normal. Judgment and thought content normal.       Patient has been counseled extensively about nutrition and exercise as well as the importance of adherence with medications and regular follow-up. The patient was given clear instructions to go to ER or return to medical center if symptoms don't improve, worsen or new problems develop. The patient verbalized understanding.   Follow-up: Return in about 6 months (around 12/06/2018).   Claiborne Rigg, FNP-BC [facility] and [facility] [place], [place] [phone]   06/07/2018, 11:46 AM

## 2018-06-14 ENCOUNTER — Ambulatory Visit: Payer: Self-pay

## 2018-09-17 ENCOUNTER — Encounter (HOSPITAL_COMMUNITY): Payer: Self-pay | Admitting: Emergency Medicine

## 2018-09-17 ENCOUNTER — Emergency Department (HOSPITAL_COMMUNITY): Payer: Self-pay

## 2018-09-17 ENCOUNTER — Emergency Department (HOSPITAL_COMMUNITY)
Admission: EM | Admit: 2018-09-17 | Discharge: 2018-09-17 | Disposition: A | Discharge status: Home / Self Care | Payer: Self-pay | Attending: Emergency Medicine | Admitting: Emergency Medicine

## 2018-09-17 ENCOUNTER — Other Ambulatory Visit: Payer: Self-pay

## 2018-09-17 DIAGNOSIS — Z5321 Procedure and treatment not carried out due to patient leaving prior to being seen by health care provider: Secondary | ICD-10-CM | POA: Insufficient documentation

## 2018-09-17 DIAGNOSIS — R079 Chest pain, unspecified: Secondary | ICD-10-CM | POA: Insufficient documentation

## 2018-09-17 LAB — COMPREHENSIVE METABOLIC PANEL
ALT: 17 U/L (ref 0–44)
AST: 20 U/L (ref 15–41)
Albumin: 4.1 g/dL (ref 3.5–5.0)
Alkaline Phosphatase: 40 U/L (ref 38–126)
Anion gap: 7 (ref 5–15)
BUN: 10 mg/dL (ref 6–20)
CO2: 29 mmol/L (ref 22–32)
Calcium: 9.1 mg/dL (ref 8.9–10.3)
Chloride: 103 mmol/L (ref 98–111)
Creatinine, Ser: 0.85 mg/dL (ref 0.61–1.24)
GFR calc non Af Amer: >60 mL/min (ref >60)
Glucose, Bld: 99 mg/dL (ref 70–99)
Potassium: 3.8 mmol/L (ref 3.5–5.1)
Sodium: 139 mmol/L (ref 135–145)
Total Bilirubin: 0.9 mg/dL (ref 0.3–1.2)
Total Protein: 6.8 g/dL (ref 6.5–8.1)

## 2018-09-17 LAB — CBC
HCT: 42.4 % (ref 39.0–52.0)
Hemoglobin: 12.9 g/dL — ABNORMAL LOW (ref 13.0–17.0)
MCH: 23.6 pg — ABNORMAL LOW (ref 26.0–34.0)
MCHC: 30.4 g/dL (ref 30.0–36.0)
MCV: 77.5 fL — ABNORMAL LOW (ref 80.0–100.0)
NRBC: 0 % (ref 0.0–0.2)
Platelets: 225 10*3/uL (ref 150–400)
RBC: 5.47 MIL/uL (ref 4.22–5.81)
RDW: 12.5 % (ref 11.5–15.5)
WBC: 6.3 10*3/uL (ref 4.0–10.5)

## 2018-09-17 LAB — URINALYSIS, ROUTINE W REFLEX MICROSCOPIC
Bilirubin Urine: NEGATIVE
Glucose, UA: NEGATIVE mg/dL
Hgb urine dipstick: NEGATIVE
Ketones, ur: 5 mg/dL — AB
Leukocytes, UA: NEGATIVE
Nitrite: NEGATIVE
PROTEIN: NEGATIVE mg/dL
Specific Gravity, Urine: 1.025 (ref 1.005–1.030)
pH: 7 (ref 5.0–8.0)

## 2018-09-17 LAB — I-STAT TROPONIN, ED: Troponin i, poc: 0 ng/mL (ref 0.00–0.08)

## 2018-09-17 LAB — LIPASE, BLOOD: Lipase: 29 U/L (ref 11–51)

## 2018-09-17 NOTE — ED Notes (Addendum)
Pt advised he had to work in the morning and would follow up with his PCP. Pt did not wish to speak to anyone before leaving.

## 2018-09-17 NOTE — ED Triage Notes (Signed)
Pt reports he has been having chest pain for a week and mid lower abd pain for a month. Pt reports not going to his PCP. Pt denies any nausea, vomiting, or diarrhea.

## 2018-10-07 MED FILL — XOFLUZA 20 (2) MG TBPK: 2 X 20 | 7 days supply | Qty: 1 | Fill #0 | Status: TO

## 2018-10-10 ENCOUNTER — Emergency Department (HOSPITAL_COMMUNITY)
Admission: EM | Admit: 2018-10-10 | Discharge: 2018-10-10 | Disposition: A | Discharge status: Home / Self Care | Payer: Self-pay | Attending: Emergency Medicine | Admitting: Emergency Medicine

## 2018-10-10 ENCOUNTER — Encounter (HOSPITAL_COMMUNITY): Payer: Self-pay

## 2018-10-10 ENCOUNTER — Other Ambulatory Visit: Payer: Self-pay

## 2018-10-10 ENCOUNTER — Emergency Department (HOSPITAL_COMMUNITY): Payer: Self-pay

## 2018-10-10 DIAGNOSIS — R0602 Shortness of breath: Secondary | ICD-10-CM | POA: Insufficient documentation

## 2018-10-10 DIAGNOSIS — K219 Gastro-esophageal reflux disease without esophagitis: Secondary | ICD-10-CM | POA: Insufficient documentation

## 2018-10-10 DIAGNOSIS — Z87891 Personal history of nicotine dependence: Secondary | ICD-10-CM | POA: Insufficient documentation

## 2018-10-10 DIAGNOSIS — Z79899 Other long term (current) drug therapy: Secondary | ICD-10-CM | POA: Insufficient documentation

## 2018-10-10 DIAGNOSIS — R0789 Other chest pain: Secondary | ICD-10-CM | POA: Insufficient documentation

## 2018-10-10 LAB — CBC WITH DIFFERENTIAL/PLATELET
Abs Immature Granulocytes: 0 10*3/uL (ref 0.00–0.07)
BASOS PCT: 3 %
Basophils Absolute: 0.1 10*3/uL (ref 0.0–0.1)
Eosinophils Absolute: 0 10*3/uL (ref 0.0–0.5)
Eosinophils Relative: 0 %
HCT: 44 % (ref 39.0–52.0)
Hemoglobin: 13.2 g/dL (ref 13.0–17.0)
Lymphocytes Relative: 59 %
Lymphs Abs: 2.5 10*3/uL (ref 0.7–4.0)
MCH: 23.1 pg — ABNORMAL LOW (ref 26.0–34.0)
MCHC: 30 g/dL (ref 30.0–36.0)
MCV: 77.1 fL — ABNORMAL LOW (ref 80.0–100.0)
Monocytes Absolute: 0.3 10*3/uL (ref 0.1–1.0)
Monocytes Relative: 6 %
Neutro Abs: 1.3 10*3/uL — ABNORMAL LOW (ref 1.7–7.7)
Neutrophils Relative %: 32 %
Platelets: 225 10*3/uL (ref 150–400)
RBC: 5.71 MIL/uL (ref 4.22–5.81)
RDW: 12.1 % (ref 11.5–15.5)
WBC: 4.2 10*3/uL (ref 4.0–10.5)
nRBC: 0 % (ref 0.0–0.2)

## 2018-10-10 LAB — COMPREHENSIVE METABOLIC PANEL
ALBUMIN: 3.9 g/dL (ref 3.5–5.0)
ALT: 18 U/L (ref 0–44)
AST: 29 U/L (ref 15–41)
Alkaline Phosphatase: 38 U/L (ref 38–126)
Anion gap: 13 (ref 5–15)
BILIRUBIN TOTAL: 1.2 mg/dL (ref 0.3–1.2)
BUN: 12 mg/dL (ref 6–20)
CALCIUM: 8.7 mg/dL — AB (ref 8.9–10.3)
CO2: 26 mmol/L (ref 22–32)
Chloride: 95 mmol/L — ABNORMAL LOW (ref 98–111)
Creatinine, Ser: 0.84 mg/dL (ref 0.61–1.24)
GFR calc Af Amer: >60 mL/min (ref >60)
GFR calc non Af Amer: >60 mL/min (ref >60)
Glucose, Bld: 94 mg/dL (ref 70–99)
Potassium: 3.8 mmol/L (ref 3.5–5.1)
Sodium: 134 mmol/L — ABNORMAL LOW (ref 135–145)
TOTAL PROTEIN: 7.2 g/dL (ref 6.5–8.1)

## 2018-10-10 LAB — I-STAT TROPONIN, ED: Troponin i, poc: 0 ng/mL (ref 0.00–0.08)

## 2018-10-10 LAB — LIPASE, BLOOD: Lipase: 32 U/L (ref 11–51)

## 2018-10-10 MED ORDER — LIDOCAINE VISCOUS HCL 2 % MT SOLN
15.0000 mL | Freq: Once | OROMUCOSAL | Status: AC
  Administered 2018-10-10: 15 mL via ORAL
  Filled 2018-10-10: qty 15

## 2018-10-10 MED ORDER — SODIUM CHLORIDE 0.9 % IV BOLUS
1000.0000 mL | Freq: Once | INTRAVENOUS | Status: AC
  Administered 2018-10-10: 1000 mL via INTRAVENOUS

## 2018-10-10 MED ORDER — ALUM & MAG HYDROXIDE-SIMETH 200-200-20 MG/5ML PO SUSP
30.0000 mL | Freq: Once | ORAL | Status: AC
  Administered 2018-10-10: 30 mL via ORAL
  Filled 2018-10-10: qty 30

## 2018-10-10 MED ORDER — SUCRALFATE 1 GM/10ML PO SUSP: 1.0000 g | Freq: Three times a day (TID) | ORAL | 0 refills | Status: DC

## 2018-10-10 MED ORDER — PANTOPRAZOLE SODIUM 20 MG PO TBEC: 20.0000 mg | DELAYED_RELEASE_TABLET | Freq: Every day | ORAL | 0 refills | Status: DC

## 2018-10-10 MED ORDER — IPRATROPIUM-ALBUTEROL 0.5-2.5 (3) MG/3ML IN SOLN
3.0000 mL | Freq: Once | RESPIRATORY_TRACT | Status: AC
  Administered 2018-10-10: 3 mL via RESPIRATORY_TRACT
  Filled 2018-10-10: qty 3

## 2018-10-10 NOTE — Discharge Instructions (Signed)
Take Carafate as directed.  Take Protonix as directed.  As we discussed, NSAIDs (Advil, Motrin) and smoking can make your symptoms worse.  Additionally, spicy and fatty foods can make your symptoms worse.  I provided you with outpatient GI referral for you to follow-up with.  Return to the Emergency Department immediately if you experiencing worsening chest pain, difficulty breathing, nausea/vomiting, get very sweaty, headache or any other worsening or concerning symptoms.

## 2018-10-10 NOTE — ED Triage Notes (Signed)
Pt was also referred for lung exam in September but never got it checked out.

## 2018-10-10 NOTE — ED Triage Notes (Signed)
Pt getting over the flu, states that he is having chest tightness and feels like food his getting stuck in his chest, occasional SOB. Hx GERD.

## 2018-10-10 NOTE — ED Provider Notes (Signed)
[facility] Provider Note   CSN: [id number] Arrival date & time: 10/10/18  1806     History   Chief Complaint Chief Complaint  Patient presents with  . Chest Pain  . Shortness of Breath    HPI Sean Park is a 28 y.o. male history of bronchitis, dysphagia, GERD, muscle spasm who presents for evaluation of chest pain that has been ongoing for the last 3 weeks.  Patient reports he was recently diagnosed with the flu approximately 4 days ago.  Patient states that for the last 3 weeks, he felt like whenever he ate something, he would have pain in his chest.  He states "it feels like it gets lodged in my chest and does not go down."  He states he has been able to swallow without any difficulty and states he has not had any vomiting.  He does report that the pain is only brought on by eating or drinking.  Patient states that he also feels like when he has this, he has difficulty breathing.  He denies any difficulty breathing at any other times denies any dyspnea on exertion.  Pain is not brought on by exertion.  He states he felt like the pain was getting worse this morning, prompting ED visit.  He does report a history of GERD and states he has had some GERD pain in his chest, epigastric region also.  Patient reports he used to take Carafate but is not taking it anymore.  Patient states he was recently diagnosed with the flu.  He states he is continued to have cough and states cough is productive of green sputum.  He is also noted some blood flecks in it but denies any gross hemoptysis.  Patient reports he smokes marijuana.  He denies any cigarette, vaping use.  Patient denies any IV or cocaine use.  He states he does not have any family history of heart attacks. He denies any exogenous hormone use, recent immobilization, prior history of DVT/PE, recent surgery, leg swelling, or long travel.  The history is provided by the patient.    Past Medical History:    Diagnosis Date  . Anemia   . Anxiety   . Bronchitis   . Dysphagia   . GERD (gastroesophageal reflux disease)   . Muscle spasm   . Rotator cuff tear     Patient Active Problem List   Diagnosis Date Noted  . Rotator cuff tendinitis, left 04/01/2017  . Scapular dyskinesis 04/01/2017  . Dyshidrotic eczema 02/05/2017  . Right hand pain 12/25/2016  . Impingement syndrome of left shoulder 04/13/2015  . Anxiety and depression 03/13/2015  . GERD (gastroesophageal reflux disease) 03/02/2015    Past Surgical History:  Procedure Laterality Date  . COLONOSCOPY          Home Medications    Prior to Admission medications   Medication Sig Start Date End Date Taking? Authorizing Provider  Multiple Vitamin (MULTIVITAMIN WITH MINERALS) TABS tablet Take 1 tablet by mouth daily.    [provider]  omeprazole (PRILOSEC) 40 MG capsule Take 1 capsule (40 mg total) by mouth daily. Patient not taking: Reported on 06/07/2018 02/19/18   Claiborne RiggFleming, Zelda W, NP  pantoprazole (PROTONIX) 20 MG tablet Take 1 tablet (20 mg total) by mouth daily. 10/10/18   Maxwell CaulLayden, Haddy Mullinax A, PA-C  sodium phosphate (FLEET) 7-19 GM/118ML ENEM Place 133 mLs (1 enema total) rectally daily as needed for severe constipation. Patient not taking: Reported on 04/26/2018 01/02/18  Roxy Horseman, PA-C  sucralfate (CARAFATE) 1 GM/10ML suspension Take 10 mLs (1 g total) by mouth 4 (four) times daily -  with meals and at bedtime. 10/10/18   Maxwell Caul, PA-C    Family History Family History  Problem Relation Age of Onset  . Diabetes Father   . Anxiety disorder Father   . Brain cancer Father   . Breast cancer Mother   . Crohn's disease Brother   . Schizophrenia Cousin   . Depression Maternal Aunt   . Dementia Maternal Grandmother   . Colon cancer Neg Hx   . Esophageal cancer Neg Hx   . Stomach cancer Neg Hx   . Rectal cancer Neg Hx     Social History Social History   Tobacco Use  . Smoking status: Former  Smoker    Types: Cigarettes    Last attempt to quit: 09/15/2014    Years since quitting: 4.0  . Smokeless tobacco: Never Used  . Tobacco comment: Smoke Marijuana   Substance Use Topics  . Alcohol use: Yes    Alcohol/week: 0.0 standard drinks    Comment: occassionally  . Drug use: Yes    Types: Marijuana     Allergies   Patient has no known allergies.   Review of Systems Review of Systems  Constitutional: Negative for fever.  Respiratory: Positive for cough and shortness of breath.   Cardiovascular: Positive for chest pain.  Gastrointestinal: Negative for abdominal pain, nausea and vomiting.  Genitourinary: Negative for dysuria and hematuria.  Neurological: Negative for headaches.  All other systems reviewed and are negative.    Physical Exam Updated Vital Signs BP 114/76 (BP Location: Left Arm)   Pulse 63   Temp (!) 97.5 F (36.4 C) (Oral)   Resp 16   Ht 5\' 11"  (1.803 m)   Wt 67.6 kg   SpO2 100%   BMI 20.78 kg/m   Physical Exam Vitals signs and nursing note reviewed.  Constitutional:      Appearance: Normal appearance. He is well-developed.  HENT:     Head: Normocephalic and atraumatic.  Eyes:     General: Lids are normal.     Conjunctiva/sclera: Conjunctivae normal.     Pupils: Pupils are equal, round, and reactive to light.  Neck:     Musculoskeletal: Full passive range of motion without pain.  Cardiovascular:     Rate and Rhythm: Normal rate and regular rhythm.     Pulses: Normal pulses.     Heart sounds: Normal heart sounds. No murmur. No friction rub. No gallop.   Pulmonary:     Effort: Pulmonary effort is normal.     Breath sounds: Wheezing present. No decreased breath sounds.     Comments: Mild wheezing noted at the bilateral bases.  No evidence of respiratory distress.  Able speak in full sentences without any difficulty. Abdominal:     Palpations: Abdomen is soft. Abdomen is not rigid.     Tenderness: There is no abdominal tenderness. There is  no guarding.     Comments: Abdomen is soft, non-distended, non-tender. No rigidity, No guarding. No peritoneal signs.  Musculoskeletal: Normal range of motion.  Skin:    General: Skin is warm and dry.     Capillary Refill: Capillary refill takes less than 2 seconds.  Neurological:     Mental Status: He is alert and oriented to person, place, and time.  Psychiatric:        Speech: Speech normal.  ED Treatments / Results  Labs (all labs ordered are listed, but only abnormal results are displayed) Labs Reviewed  COMPREHENSIVE METABOLIC PANEL - Abnormal; Notable for the following components:      Result Value   Sodium 134 (*)    Chloride 95 (*)    Calcium 8.7 (*)    All other components within normal limits  CBC WITH DIFFERENTIAL/PLATELET - Abnormal; Notable for the following components:   MCV 77.1 (*)    MCH 23.1 (*)    Neutro Abs 1.3 (*)    All other components within normal limits  LIPASE, BLOOD  I-STAT TROPONIN, ED    EKG EKG Interpretation  Date/Time:  Sunday October 10 2018 18:17:24 EST Ventricular Rate:  65 PR Interval:  156 QRS Duration: 78 QT Interval:  408 QTC Calculation: 424 R Axis:   84 Text Interpretation:  Normal sinus rhythm No significant change since last tracing Confirmed by Ray, Danielle ([id number]) on 10/10/2018 8:01:21 PM   Radiology Dg Chest 2 View  Result Date: 10/10/2018 CLINICAL DATA:  Left-sided chest pain. EXAM: CHEST - 2 VIEW COMPARISON:  09/17/2018 FINDINGS: Cardiomediastinal silhouette is normal. Mediastinal contours appear intact. There is no evidence of focal airspace consolidation, pleural effusion or pneumothorax. Osseous structures are without acute abnormality. Soft tissues are grossly normal. IMPRESSION: No active cardiopulmonary disease. Electronically Signed   By: Dobrinka  Dimitrova M.D.   On: 10/10/2018 18:50    Procedures Procedures (including critical care time)  Medications Ordered in ED Medications    ipratropium-albuterol (DUONEB) 0.5-2.5 (3) MG/3ML nebulizer solution 3 mL (3 mLs Nebulization Given 10/10/18 1858)  alum & mag hydroxide-simeth (MAALOX/MYLANTA) 200-200-20 MG/5ML suspension 30 mL (30 mLs Oral Given 10/10/18 1857)    And  lidocaine (XYLOCAINE) 2 % viscous mouth solution 15 mL (15 mLs Oral Given 10/10/18 1857)  sodium chloride 0.9 % bolus 1,000 mL (0 mLs Intravenous Stopped 10/10/18 2105)     Initial Impression / Assessment and Plan / ED Course  I have reviewed the triage vital signs and the nursing notes.  Pertinent labs & imaging results that were available during my care of the patient were reviewed by me and considered in my medical decision making (see chart for details).     27  year old male with past medical history of GERD, dysphasia, bronchitis who presents for evaluation of chest pain that is been ongoing for 3 weeks.  He states that the chest pain only occurs when he eats something.  He states that when he eats, he feels like it gets stuck in his chest and causes pain.  Also reports that he has shortness of breath at this time.  No exertional shortness of breath or pain.  He states he has not had any pleuritic chest pain.  Does report a history of GERD but states he has not been taking his medications. Patient is afebrile, non-toxic appearing, sitting comfortably on examination table. Vital signs reviewed and stable.  Consider infectious process versus GERD etiology versus musculoskeletal.  Suspicion for ACS etiology given history/physical exam but also consideration. Patient is PERC negative and is low risk for PE.   CBC shows evidence leukocytosis or anemia.  CMP unremarkable.  Lipase unremarkable.  Troponin unremarkable.  Chest x-ray negative for any acute factious etiology or abnormality.  Reevaluation after GI cocktail.  Patient reports improvement in symptoms.  Repeat evaluation of lung exam shows improved wheezing at the bases after nebulizer treatment.  I suspect  that this may be related to GERD/peptic ulcer  disease.  He does report he has a history of both and states he has not seen GI in several years.  He also has not been on any of his current medications.  Patient is able to tolerate p.o. without any difficulty.  He has not had any vomiting since being here in the ED.  Do not feel this is esophageal blockage at this time given ability to tolerate secretions, p.o.  At this time, patient exhibits no emergent life-threatening condition that require further evaluation in ED or admission. Plan for outpatient GI referral for further evaluation. Patient had ample opportunity for questions and discussion. All patient's questions were answered with full understanding. Strict return precautions discussed. Patient expresses understanding and agreement to plan.   Portions of this note were generated with Scientist, clinical (histocompatibility and immunogenetics). Dictation errors may occur despite best attempts at proofreading.   Final Clinical Impressions(s) / ED Diagnoses   Final diagnoses:  Atypical chest pain  Gastroesophageal reflux disease, esophagitis presence not specified    ED Discharge Orders         Ordered    sucralfate (CARAFATE) 1 GM/10ML suspension  3 times daily with meals & bedtime     10/10/18 2103    pantoprazole (PROTONIX) 20 MG tablet  Daily     10/10/18 2103           Rosana Hoes 10/10/18 2325    Margarita Grizzle, MD 10/14/18 [phone]

## 2018-10-11 MED FILL — PANTOPRAZOLE SOD DR 20 MG T: 20 | 30 days supply | Qty: 30 | Fill #0

## 2018-10-11 MED FILL — CARAFATE 1 GM/10 ML SUSP: 1 | 10 days supply | Qty: 420 | Fill #0

## 2018-10-21 ENCOUNTER — Ambulatory Visit: Payer: Self-pay | Admitting: Nurse Practitioner

## 2018-10-28 ENCOUNTER — Ambulatory Visit: Payer: Self-pay | Admitting: Nurse Practitioner

## 2018-11-02 ENCOUNTER — Ambulatory Visit: Payer: Self-pay | Admitting: Nurse Practitioner

## 2018-11-16 ENCOUNTER — Ambulatory Visit (INDEPENDENT_AMBULATORY_CARE_PROVIDER_SITE_OTHER): Payer: Self-pay | Admitting: Nurse Practitioner

## 2018-11-16 ENCOUNTER — Encounter: Payer: Self-pay | Admitting: Nurse Practitioner

## 2018-11-16 VITALS — BP 116/70 | HR 63 | Ht 71.0 in | Wt 152.0 lb

## 2018-11-16 DIAGNOSIS — K219 Gastro-esophageal reflux disease without esophagitis: Secondary | ICD-10-CM

## 2018-11-16 DIAGNOSIS — K5900 Constipation, unspecified: Secondary | ICD-10-CM

## 2018-11-16 MED ORDER — SUCRALFATE 1 G PO TABS: ORAL_TABLET | ORAL | 2 refills | Status: DC

## 2018-11-16 NOTE — Progress Notes (Signed)
ASSESSMENT / PLAN:   22.  28 year old male with a history of GERD, off treatment for last couple of years.  Seen in ED 10/10/2018 for chest pain.  CXR,  troponin, EKG, labs unrevealing.  Pain relieved in ED with GI cocktail and continued relief with Carafate and PPI but out of both now and having recurrent heartburn this morning.  -Recommended he continue daily PPI 30 minutes before breakfast, or lunch as he seems to prefer. -Antireflux measures discussed -I will give him Carafate to keep on hand as he finds it very helpful.  Going forward he hopefully won't need Carafate on a regular basis.   -Advised to follow-up with me in approximately 4 weeks  2.  Lower abdominal discomfort x2 months, possibly secondary to constipation.  He reports hard stools.  Carafate may have contributed to the constipation but may also have IBS. He had a normal colonoscopy October 2016 for evaluation of bowel changes and abdominal pain -Daily Benefiber -2 stool softeners at bedtime -Reevaluation at time of follow-up appointment   HPI:    Chief Complaint:   Abdominal discomfort, heartburn  Sean Park is a 28 yo male known to Dr. Adela Lank. He was evaluated for upper abdominal pain / nausea / GERD / dysphagia and microcytic anemia in 2016.  Iron studies were normal.  He underwent EGD and colonoscopy. Except for gastritis endoscopic workup was negative. Small bowel biopsies negative   GI procedures:  07/11/19 EGD -  unremarkable except for mild, chronic inactive gastritis.  No H. pylori.  Normal small bowel biopsies  07/11/19 colonoscopy - normal.  Following upper endoscopy patient took a PPI consistently for several months then changed to taking only as needed.  He hasn't been on a PPI for the last couple of years and reports frequent regurgitation and heartburn.  Patient was seen in the ED 10/10/2018 for evaluation of chest pain.  No evidence for cardiac event.  Labs were basically unremarkable.   Given GI cocktail,  which helped.  Discharged home and did well with Carafate and PPI but out of meds for several days and and GERD symptoms returning.  Thankfully he does not consume much caffeine.  No tobacco use, he does smoke marijuana.  He no longer drinks alcohol  Colsyn also complains of lower abdominal discomfort.  The discomfort is achy, sometimes burning and feels like trapped gas.  Pain alleviated with bowel movements.  His stools are hard though he has a bowel movement every day.  No blood in his stool.  No urinary symptoms . He is drinking adequate amounts of water, doubts adequate fiber intake however.   Data Reviewed:  ED on 10/10/18 WBC 4.2, hemoglobin 13.2, MCV 77 Lipase 32 Liver test normal Troponin negative EKG-normal sinus rhythm, no changes since last tracing   Past Medical History:  Diagnosis Date  . Anemia   . Anxiety   . Bronchitis   . Dysphagia   . GERD (gastroesophageal reflux disease)   . Muscle spasm   . Rotator cuff tear      Past Surgical History:  Procedure Laterality Date  . COLONOSCOPY     Family History  Problem Relation Age of Onset  . Diabetes Father   . Anxiety disorder Father   . Brain cancer Father   . Breast cancer Mother   . Crohn's disease Brother   . Schizophrenia Cousin   . Depression Maternal Aunt   .  Dementia Maternal Grandmother   . Colon cancer Neg Hx   . Esophageal cancer Neg Hx   . Stomach cancer Neg Hx   . Rectal cancer Neg Hx    Social History   Tobacco Use  . Smoking status: Former Smoker    Types: Cigarettes    Last attempt to quit: 09/15/2014    Years since quitting: 4.1  . Smokeless tobacco: Never Used  . Tobacco comment: Smoke Marijuana   Substance Use Topics  . Alcohol use: Yes    Alcohol/week: 0.0 standard drinks    Comment: occassionally  . Drug use: Yes    Types: Marijuana   Current Outpatient Medications  Medication Sig Dispense Refill  . Multiple Vitamin (MULTIVITAMIN WITH MINERALS) TABS tablet  Take 1 tablet by mouth daily.    Marland Kitchen omeprazole (PRILOSEC) 40 MG capsule Take 1 capsule (40 mg total) by mouth daily. (Patient not taking: Reported on 06/07/2018) 90 capsule 3   No current facility-administered medications for this visit.    No Known Allergies   Review of Systems: All systems reviewed and negative except where noted in HPI.   Creatinine clearance cannot be calculated (Patient's most recent lab result is older than the maximum 21 days allowed.)   Physical Exam:    Wt Readings from Last 3 Encounters:  11/16/18 152 lb (68.9 kg)  10/10/18 149 lb (67.6 kg)  09/17/18 150 lb (68 kg)    BP 116/70   Pulse 63   Ht 5\' 11"  (1.803 m)   Wt 152 lb (68.9 kg)   BMI 21.20 kg/m  Constitutional:  Pleasant male in no acute distress. Psychiatric: Normal mood and affect. Behavior is normal. EENT: Pupils normal.  Conjunctivae are normal. No scleral icterus. Neck supple.  Cardiovascular: Normal rate, regular rhythm. No edema Pulmonary/chest: Effort normal and breath sounds normal. No wheezing, rales or rhonchi. Abdominal: Soft, nondistended, nontender. Bowel sounds active throughout. There are no masses palpable. No hepatomegaly. Neurological: Alert and oriented to person place and time. Skin: Skin is warm and dry. No rashes noted.  Willette Cluster, NP  11/16/2018, 11:01 AM

## 2018-11-16 NOTE — Patient Instructions (Addendum)
If you are age 30 or older, your body mass index should be between 23-30. Your Body mass index is 21.2 kg/m. If this is out of the aforementioned range listed, please consider follow up with your Primary Care Provider.  If you are age 8 or younger, your body mass index should be between 19-25. Your Body mass index is 21.2 kg/m. If this is out of the aformentioned range listed, please consider follow up with your Primary Care Provider.    Start Benefiber once daily. Start stool softener 2 by mouth at bedtime.  We have sent the following medications to your pharmacy for you to pick up at your convenience: Carafate   Thank you for choosing me and [facility].  Midge Minium

## 2018-11-17 NOTE — Progress Notes (Signed)
Agree with assessment and plan as outlined.  

## 2018-12-03 ENCOUNTER — Other Ambulatory Visit: Payer: Self-pay

## 2018-12-03 ENCOUNTER — Encounter: Payer: Self-pay | Admitting: Nurse Practitioner

## 2018-12-03 ENCOUNTER — Ambulatory Visit: Payer: Self-pay | Attending: Nurse Practitioner | Admitting: Nurse Practitioner

## 2018-12-03 VITALS — BP 116/70 | HR 90 | Temp 99.5°F | Ht 71.0 in | Wt 154.0 lb

## 2018-12-03 DIAGNOSIS — F419 Anxiety disorder, unspecified: Secondary | ICD-10-CM

## 2018-12-03 DIAGNOSIS — R34 Anuria and oliguria: Secondary | ICD-10-CM

## 2018-12-03 DIAGNOSIS — F329 Major depressive disorder, single episode, unspecified: Secondary | ICD-10-CM

## 2018-12-03 LAB — POCT URINALYSIS DIP (CLINITEK)
Bilirubin, UA: NEGATIVE
Blood, UA: NEGATIVE
Glucose, UA: NEGATIVE mg/dL
Ketones, POC UA: NEGATIVE mg/dL
Leukocytes, UA: NEGATIVE
Nitrite, UA: NEGATIVE
SPEC GRAV UA: 1.015 (ref 1.010–1.025)
Urobilinogen, UA: 2 E.U./dL — AB
pH, UA: 7.5 (ref 5.0–8.0)

## 2018-12-03 NOTE — Patient Instructions (Addendum)
Behavioral Health Resources:   What if I or someone I know is in crisis?   If you are thinking about harming yourself or having thoughts of suicide, or if you know someone who is, seek help right away.   Call your doctor or mental health care provider.   Call 911 or go to a hospital emergency room to get immediate help, or ask a friend or family member to help you do these things.   Call the [place] National Suicide Prevention Lifelines toll-free, 24-hour hotline at 1-800-273-TALK 936-670-8992) or TTY: 808-736-1517 TTY 267-835-4821) to talk to a trained counselor.   If you are in crisis, make sure you are not left alone.    If someone else is in crisis, make sure he or she is not left alone   24 Hour Availability  [facility]  [address], [place], [place] [address]  [phone] or [phone]  [facility] [employer] (Domestic Violence, Rape & Victim Assistance [phone]  [facility]  [address], [place]  [address]               [phone] or [phone]  [facility] [place] [facility]    (ONLY from 8am-4pm)    [phone]  [facility] (24/7)   [phone]  [place] National Suicide Hotline   202-845-7912 Len Childs)  Support from local police to aid getting patient to hospital (http://www.[place].gov/index.aspx?page=2797)         ONGOING BEHAVIORAL HEALTH SUPPORT FOR UNINSURED and UNDERINSURED:  [facility]  ([phone]   [address]  Walk-in first time, Monday-Friday, 8:30am-5:00pm  *Bring snack, drink, something to do, long wait at first visit, they do have pharmacy for behavioral health medications/ Bring own interpreter at first visit, if needed  [employer] [facility] [employer]  [phone]  [address]  Walk-in Monday-Friday, 8:30am-12pm & 1-2:30pm  *pacientes que hablen espanol, favor comunicarse con el Sr.  [employer], extension 2244 o la [address], extension 3331 para hacer [facility]:  [phone] or kellinfoundation@gmail .com  [address]  Call or email, may self-refer  * uninsured/underinsured, [phone], have both mental health and substance use challenges   [facility]:  Phone [phone]; Fax [phone]  *Call to schedule an appointment  3rd Floor located @?[address], corner of [address]  Mon-Thursday: 8:30am-8:00pm Friday: 8:30am-7:00pm  * Be sure to park in a space labeled Psychology Department, located to the right of the main door of the building. Enter the main doors facing the parking lot and take the elevator or stairs to the 3rd Floor.   [facility]:  [phone] or  [phone] (24/hour helpline)  [address]  Call to make appointment, tends to be a long wait to begin services, depending on insurance    [facility]  [phone] ??  *Call to schedule an appointment   [address]  Monday-Friday, 8:00am-5:00pm            [facility]  [phone], [place]  Monday-Friday, walk-in 8am-3pm  First appointment is assessment, then will make appointment for psychiatry      Complicated Grief Grief is a normal response to the death of someone close to you. Feelings of fear, anger, and guilt can affect almost everyone who loses a  loved one. It is also common to have symptoms of depression while you are grieving. These include problems with sleep, loss of appetite, and lack of energy. They may last for weeks or months after a loss. Complicated grief is different from normal grief or depression. Normal grieving involves sadness and feelings of loss, but those feelings get better and heal over time. Complicated grief is a severe type of grief that lasts for a long time, usually for several months to a year or longer. It interferes with  your ability to function normally. Complicated grief may require treatment from a mental health care provider. What are the causes? The cause of this condition is not known. It is not clear why some people continue to struggle with grief and others do not. What increases the risk? You are more likely to develop this condition if:  The death of your loved one was sudden or unexpected.  The death of your loved one was due to a violent event.  Your loved one died from suicide.  Your loved one was a child or a young person.  You were very close to your loved one, or you were dependent on him or her.  You have a history of depression or anxiety. What are the signs or symptoms? Symptoms of this condition include:  Feeling disbelief or having a lack of emotion (numbness).  Being unable to enjoy good memories of your loved one.  Needing to avoid anything or anyone that reminds you of your loved one.  Being unable to stop thinking about the death.  Feeling intense anger or guilt.  Feeling alone and hopeless.  Feeling that your life is meaningless and empty.  Losing the desire to move on with your life. How is this diagnosed? This condition may be diagnosed based on:  Your symptoms. Complicated grief will be diagnosed if you have ongoing symptoms of grief for 6-12 months or longer.  The effect of symptoms on your life. You may be diagnosed with this condition if your symptoms are interfering with your ability to live your life. Your health care provider may recommend that you see a mental health care provider. Many symptoms of depression are similar to the symptoms of complicated grief. It is important to be evaluated for complicated grief along with other mental health conditions. How is this treated? This condition is most commonly treated with talk therapy. This therapy is offered by a mental health specialist (psychiatrist). During therapy:  You will learn healthy ways to cope  with the loss of your loved one.  Your mental health care provider may recommend antidepressant medicines. Follow these instructions at home: Lifestyle   Take care of yourself. ? Eat on a regular basis, and maintain a healthy diet. Eat plenty of fruits, vegetables, lean protein, and whole grains. ? Try to get some exercise each day. Aim for 30 minutes of exercise on most days of the week. ? Keep a consistent sleep schedule. Try to get 8 or more hours of sleep each night. ? Start doing the things that you used to enjoy.  Do not use drugs or alcohol to ease your symptoms.  Spend time with friends and loved ones. General instructions  Take over-the-counter and prescription medicines only as told by your health care provider.  Consider joining a grief (bereavement) support group to help you deal with your loss.  Keep all follow-up visits as told by your health care provider. This is important. Contact a health care provider  if:  Your symptoms prevent you from functioning normally.  Your symptoms do not get better with treatment. Get help right away if:  You have serious thoughts about hurting yourself or someone else.  You have suicidal feelings. If you ever feel like you may hurt yourself or others, or have thoughts about taking your own life, get help right away. You can go to your nearest emergency department or call:  Your local emergency services (911 in the U.S[place]  A suicide crisis helpline, such as the National Suicide Prevention Lifeline at 602-524-2134. This is open 24 hours a day. Summary  Complicated grief is a severe type of grief that lasts for a long time. This grief is not likely to go away on its own. Get the help you need.  Some griefs are more difficult than others and can cause this condition. You may need a certain type of treatment to help you recover if the loss of your loved one was sudden, violent, or due to suicide.  You may feel guilty about moving  on with your life. Getting help does not mean that you are forgetting your loved one. It means that you are taking care of yourself.  Complicated grief is best treated with talk therapy. Medicines may also be prescribed.  Seek the help you need, and find support that will help you recover. This information is not intended to replace advice given to you by your health care provider. Make sure you discuss any questions you have with your health care provider. Document Released: 09/01/2005 Document Revised: 06/17/2017 Document Reviewed: 06/17/2017 Elsevier Interactive Patient Education  2019 [employer].  Coping With Loss, Adult People experience loss in many different ways throughout their lives. Events such as moving, changing jobs, and losing friends can create a sense of loss. The loss may be as serious as a major health change, divorce, death of a pet, or death of a loved one. All of these types of loss are likely to create a physical and emotional reaction known as grief. Grief is the result of a major change or an absence of something or someone that you count on. Grief is a normal reaction to loss. How to recognize changes A variety of factors can affect your grieving experience, including:  The nature of your loss.  Your relationship to what or whom you lost.  Your understanding of grief and how to cope with it.  Your support system. The way that you deal with your grief will affect your ability to function as you normally do. When you are grieving, you may experience:  Numbness, shock, sadness, anxiety, anger, denial, and guilt.  Thoughts about death.  Unexpected crying.  A physical sensation of emptiness in your gut.  Problems sleeping and eating.  Fatigue.  Loss of interest in normal activities.  Dreaming about or imagining seeing the person who died.  A need to remember what or whom you lost.  Difficulty thinking about anything other than your loss for a period of  time.  Relief. If you have been expecting the loss for a while, you may feel a sense of relief when it happens. Where to find support To get support for coping with loss:  Ask your health care provider for help and recommendations, such as grief counseling or therapy.  Think about joining a support group for people who are coping with loss. Follow these instructions at home:   Be patient with yourself and others. Allow the grieving process to  happen, and remember that grieving takes time. ? It is likely that you may never feel completely done with some grief. You may find a way to move on while still cherishing memories and feelings about your loss. ? Accepting your loss is a process. It can take months or longer to adjust.  Express your feelings in healthy ways, such as: ? Talking with others about your loss. It may be helpful to find others who have had a similar loss, such as a support group. ? Writing down your feelings in a journal. ? Doing physical activities to release stress and emotional energy. ? Doing creative activities like painting, sculpting, or playing or listening to music. ? Practicing resilience. This is the ability to recover and adjust after facing challenges. Reading some resources that encourage resilience may help you to learn ways to practice those behaviors.  Keep to your normal routine as much as possible. If you have trouble focusing or doing normal activities, it is acceptable to take some time away from your normal routine.  Spend time with friends and loved ones.  Eat a healthy diet, get plenty of sleep, and rest when you feel tired. Where to find more information You can find more information about coping with loss from:  American Society of Clinical Oncology: www.cancer.net  American Psychological Association: [employer] Contact a health care provider if:  Your grief is extreme and keeps getting worse.  You have ongoing grief that does not  improve.  Your body shows symptoms of grief, such as illness.  You feel depressed, anxious, or lonely. Get help right away if:  You have thoughts about hurting yourself or others. If you ever feel like you may hurt yourself or others, or have thoughts about taking your own life, get help right away. You can go to your nearest emergency department or call:  Your local emergency services (911 in the [place].S[place]  A suicide crisis helpline, such as the National Suicide Prevention Lifeline at 639-539-4443. This is open 24 hours a day. Summary  Grief is a normal part of experiencing a loss. It is the result of a major change or an absence of something or someone that you count on.  The depth of grief and the period of recovery depend on the type of loss as well as your ability to adjust to the change and process your feelings.  Processing grief requires patience and a willingness to accept your feelings and talk about your loss with people who are supportive.  It is important to find resources that work for you and to realize that we are all different when it comes to grief. There is not one single grieving process that works for everyone in the same way.  Be aware that when grief becomes extreme, it can lead to more severe issues like isolation, depression, anxiety, or suicidal thoughts. Talk with your health care provider if you have any of these issues. This information is not intended to replace advice given to you by your health care provider. Make sure you discuss any questions you have with your health care provider. Document Released: 01/15/2017 Document Revised: 01/15/2017 Document Reviewed: 01/15/2017 Elsevier Interactive Patient Education  2019 [employer].

## 2018-12-03 NOTE — Progress Notes (Signed)
Assessment & Plan:  Sean Park was seen today for follow-up.  Diagnoses and all orders for this visit:  Anxiety and depression Declines SSRI or anxiolytic today Handout given on AVS regarding OP clinics for mental health/support groups/grief  Decreased urination -     Basic metabolic panel -     POCT URINALYSIS DIP (CLINITEK)    Patient has been counseled on age-appropriate routine health concerns for screening and prevention. These are reviewed and up-to-date. Referrals have been placed accordingly. Immunizations are up-to-date or declined.    Subjective:   Chief Complaint  Patient presents with  . Follow-up   HPI Sean Park 28 y.o. male presents to office today for follow up.    Anxiety and Depression He recently lost his father this year. And lost mother last year. Has been experiencing lots of grief. No support system aside from fiance. He and his only brother do not speak. We discussed SSRI and or anxiolytics today. He declined both. He denies any current thoughts of self harm.  Depression screen [facility] 2/9 06/07/2018 02/19/2018 12/24/2017 11/26/2017 07/08/2017  Decreased Interest 2 2 3 2 2   Down, Depressed, Hopeless 3 3 3 3 2   PHQ - 2 Score 5 5 6 5 4   Altered sleeping 2 2 2  - 1  Tired, decreased energy 2 2 2 2 2   Change in appetite 1 2 1 2 1   Feeling bad or failure about yourself  2 2 2 3 1   Trouble concentrating 1 2 1 2 1   Moving slowly or fidgety/restless 1 1 1 2 1   Suicidal thoughts 1 1 1 3 1   PHQ-9 Score 15 17 16  - 12  Some recent data might be hidden   GAD 7 : Generalized Anxiety Score 06/07/2018 02/19/2018 12/24/2017 11/26/2017  Nervous, Anxious, on Edge 1 2 2 2   Control/stop worrying 2 3 2 3   Worry too much - different things 2 2 3 3   Trouble relaxing 1 2 2 2   Restless 1 2 1 2   Easily annoyed or irritable 1 2 2 3   Afraid - awful might happen 2 2 1 3   Total GAD 7 Score 10 15 13 18     GU Problem Notes decreased urination during the weekday when he is working.  After further questioning it appears he is not drinking at much water at work as he does during the weekend. I have instructed him that the more water he drinks the more he will likely urinate and to try to drink the same amount of water at work that he does when he is not working as he reports he urinates more when he is not at work. He denies any strong urinary odor, dark colored urine, bilateral flank pain, hematuria or dysuria. Does not drink a lot of caffeine.   Review of Systems  Constitutional: Negative for fever, malaise/fatigue and weight loss.  HENT: Negative.  Negative for nosebleeds.   Eyes: Negative.  Negative for blurred vision, double vision and photophobia.  Respiratory: Negative.  Negative for cough and shortness of breath.   Cardiovascular: Negative.  Negative for chest pain, palpitations and leg swelling.  Gastrointestinal: Positive for heartburn. Negative for nausea and vomiting.  Genitourinary: Negative for dysuria, flank pain, frequency, hematuria and urgency.       SEE HPI; oliguria  Musculoskeletal: Negative.  Negative for myalgias.  Neurological: Negative.  Negative for dizziness, focal weakness, seizures and headaches.  Psychiatric/Behavioral: Positive for depression. Negative for hallucinations, memory loss and suicidal ideas. The  patient is nervous/anxious. The patient does not have insomnia.     Past Medical History:  Diagnosis Date  . Anemia   . Anxiety   . Bronchitis   . Dysphagia   . GERD (gastroesophageal reflux disease)   . Muscle spasm   . Rotator cuff tear     Past Surgical History:  Procedure Laterality Date  . COLONOSCOPY      Family History  Problem Relation Age of Onset  . Diabetes Father   . Anxiety disorder Father   . Brain cancer Father   . Breast cancer Mother   . Crohn's disease Brother   . Schizophrenia Cousin   . Depression Maternal Aunt   . Dementia Maternal Grandmother   . Colon cancer Neg Hx   . Esophageal cancer Neg Hx   .  Stomach cancer Neg Hx   . Rectal cancer Neg Hx     Social History Reviewed with no changes to be made today.   Outpatient Medications Prior to Visit  Medication Sig Dispense Refill  . Multiple Vitamin (MULTIVITAMIN WITH MINERALS) TABS tablet Take 1 tablet by mouth daily.    Marland Kitchen omeprazole (PRILOSEC) 40 MG capsule Take 1 capsule (40 mg total) by mouth daily. 90 capsule 3  . sucralfate (CARAFATE) 1 g tablet 1-2 tablets by mouth daily as needed (Patient not taking: Reported on 12/03/2018) 40 tablet 2   No facility-administered medications prior to visit.     No Known Allergies     Objective:    BP 116/70 (BP Location: Left Arm, Patient Position: Sitting, Cuff Size: Normal)   Pulse 90   Temp 99.5 F (37.5 C) (Oral)   Ht 5\' 11"  (1.803 m)   Wt 154 lb (69.9 kg)   SpO2 96%   BMI 21.48 kg/m  Wt Readings from Last 3 Encounters:  12/03/18 154 lb (69.9 kg)  11/16/18 152 lb (68.9 kg)  10/10/18 149 lb (67.6 kg)    Physical Exam Vitals signs and nursing note reviewed.  Constitutional:      Appearance: He is well-developed.  HENT:     Head: Normocephalic and atraumatic.  Neck:     Musculoskeletal: Normal range of motion.  Cardiovascular:     Rate and Rhythm: Normal rate and regular rhythm.     Heart sounds: Normal heart sounds. No murmur. No friction rub. No gallop.   Pulmonary:     Effort: Pulmonary effort is normal. No tachypnea or respiratory distress.     Breath sounds: Normal breath sounds. No decreased breath sounds, wheezing, rhonchi or rales.  Chest:     Chest wall: No tenderness.  Abdominal:     General: Abdomen is flat. Bowel sounds are normal.     Palpations: Abdomen is soft.     Tenderness: There is no abdominal tenderness. There is no right CVA tenderness or left CVA tenderness.  Musculoskeletal: Normal range of motion.  Skin:    General: Skin is warm and dry.  Neurological:     Mental Status: He is alert and oriented to person, place, and time.     Coordination:  Coordination normal.  Psychiatric:        Behavior: Behavior normal. Behavior is cooperative.        Thought Content: Thought content normal.        Judgment: Judgment normal.        Patient has been counseled extensively about nutrition and exercise as well as the importance of adherence with medications and regular follow-up.  The patient was given clear instructions to go to ER or return to medical center if symptoms don't improve, worsen or new problems develop. The patient verbalized understanding.   Follow-up: No follow-ups on file.   Claiborne Rigg, FNP-BC [facility] [place], [place] [phone]   12/06/2018, 4:30 PM

## 2018-12-04 LAB — BASIC METABOLIC PANEL
BUN/Creatinine Ratio: 9 (ref 9–20)
BUN: 8 mg/dL (ref 6–20)
CO2: 25 mmol/L (ref 20–29)
Calcium: 9.2 mg/dL (ref 8.7–10.2)
Chloride: 103 mmol/L (ref 96–106)
Creatinine, Ser: 0.85 mg/dL (ref 0.76–1.27)
GFR calc non Af Amer: 118 mL/min/{1.73_m2} (ref >59)
GFR, EST AFRICAN AMERICAN: 136 mL/min/{1.73_m2} (ref >59)
Glucose: 66 mg/dL (ref 65–99)
Potassium: 4 mmol/L (ref 3.5–5.2)
Sodium: 140 mmol/L (ref 134–144)

## 2018-12-06 ENCOUNTER — Encounter: Payer: Self-pay | Admitting: Nurse Practitioner

## 2018-12-08 ENCOUNTER — Other Ambulatory Visit: Payer: Self-pay

## 2018-12-08 ENCOUNTER — Telehealth (INDEPENDENT_AMBULATORY_CARE_PROVIDER_SITE_OTHER): Payer: Self-pay | Admitting: Physician Assistant

## 2018-12-08 NOTE — Progress Notes (Signed)
Tried to contact the patient multiple times this morning in regards to telemedicine visit for follow-up of reflux and abdominal pain.  He did not answer.  His mailbox is full.  We will attempt to contact the patient in order to reschedule.  Hyacinth Meeker, PA-C

## 2019-01-27 ENCOUNTER — Other Ambulatory Visit: Payer: Self-pay

## 2019-01-27 ENCOUNTER — Emergency Department (HOSPITAL_COMMUNITY)
Admission: EM | Admit: 2019-01-27 | Discharge: 2019-01-27 | Disposition: A | Discharge status: Home / Self Care | Payer: Self-pay | Attending: Emergency Medicine | Admitting: Emergency Medicine

## 2019-01-27 ENCOUNTER — Encounter (HOSPITAL_COMMUNITY): Payer: Self-pay | Admitting: Emergency Medicine

## 2019-01-27 DIAGNOSIS — R51 Headache: Secondary | ICD-10-CM | POA: Insufficient documentation

## 2019-01-27 DIAGNOSIS — R519 Headache, unspecified: Secondary | ICD-10-CM

## 2019-01-27 DIAGNOSIS — Z634 Disappearance and death of family member: Secondary | ICD-10-CM | POA: Insufficient documentation

## 2019-01-27 DIAGNOSIS — Z87891 Personal history of nicotine dependence: Secondary | ICD-10-CM | POA: Insufficient documentation

## 2019-01-27 DIAGNOSIS — Z79899 Other long term (current) drug therapy: Secondary | ICD-10-CM | POA: Insufficient documentation

## 2019-01-27 DIAGNOSIS — R5383 Other fatigue: Secondary | ICD-10-CM | POA: Insufficient documentation

## 2019-01-27 MED ORDER — ONDANSETRON HCL 4 MG PO TABS: 4.0000 mg | ORAL_TABLET | Freq: Four times a day (QID) | ORAL | 0 refills | Status: AC

## 2019-01-27 MED ORDER — ONDANSETRON 4 MG PO TBDP
4.0000 mg | ORAL_TABLET | Freq: Once | ORAL | Status: AC
  Administered 2019-01-27: 12:00:00 4 mg via ORAL
  Filled 2019-01-27: qty 1

## 2019-01-27 NOTE — ED Provider Notes (Signed)
[facility] EMERGENCY DEPARTMENT Provider Note   CSN: [id number] Arrival date & time: 01/27/19  1044    History   Chief Complaint Chief Complaint  Patient presents with  . Headache  . L side pain    HPI Sean Park is a 28 y.o. male.     HPI   28 year old male presents today with complaints of headache and fatigue.  Patient notes that he lost his mother 1 year ago and lost his father this year.  He notes that since Mother's Day he is now been eating or drinking ( 4days) like he used to.  He feels like he is slightly dehydrated.  He notes a spasm type pain in his right head this morning while getting out of the shower.  He notes that pain is gone now he denies any acute neurological deficits, headache, neck pain, fever and he notes some nausea when drinking water, denies any abdominal pain or vomiting.  He denies any other complaints.  He notes he is slightly down but denies any suicidal or homicidal ideations.  Notes he does have a fianc.  Past Medical History:  Diagnosis Date  . Anemia   . Anxiety   . Bronchitis   . Dysphagia   . GERD (gastroesophageal reflux disease)   . Muscle spasm   . Rotator cuff tear     Patient Active Problem List   Diagnosis Date Noted  . Rotator cuff tendinitis, left 04/01/2017  . Scapular dyskinesis 04/01/2017  . Dyshidrotic eczema 02/05/2017  . Right hand pain 12/25/2016  . Impingement syndrome of left shoulder 04/13/2015  . Anxiety and depression 03/13/2015  . GERD (gastroesophageal reflux disease) 03/02/2015    Past Surgical History:  Procedure Laterality Date  . COLONOSCOPY          Home Medications    Prior to Admission medications   Medication Sig Start Date End Date Taking? Authorizing Provider  Multiple Vitamin (MULTIVITAMIN WITH MINERALS) TABS tablet Take 1 tablet by mouth daily.    [provider]  omeprazole (PRILOSEC) 40 MG capsule Take 1 capsule (40 mg total) by mouth daily. 02/19/18    Claiborne Rigg, NP  ondansetron (ZOFRAN) 4 MG tablet Take 1 tablet (4 mg total) by mouth every 6 (six) hours. 01/27/19   Arash Karstens, Tinnie Gens, PA-C  sucralfate (CARAFATE) 1 g tablet 1-2 tablets by mouth daily as needed Patient not taking: Reported on 12/03/2018 11/16/18   Meredith Pel, NP    Family History Family History  Problem Relation Age of Onset  . Diabetes Father   . Anxiety disorder Father   . Brain cancer Father   . Breast cancer Mother   . Crohn's disease Brother   . Schizophrenia Cousin   . Depression Maternal Aunt   . Dementia Maternal Grandmother   . Colon cancer Neg Hx   . Esophageal cancer Neg Hx   . Stomach cancer Neg Hx   . Rectal cancer Neg Hx     Social History Social History   Tobacco Use  . Smoking status: Former Smoker    Types: Cigarettes    Last attempt to quit: 09/15/2014    Years since quitting: 4.3  . Smokeless tobacco: Never Used  . Tobacco comment: Smoke Marijuana   Substance Use Topics  . Alcohol use: Yes    Alcohol/week: 0.0 standard drinks    Comment: occassionally  . Drug use: Yes    Types: Marijuana     Allergies   Patient  has no known allergies.   Review of Systems Review of Systems  All other systems reviewed and are negative.    Physical Exam Updated Vital Signs BP 131/84   Pulse 69   Temp 97.9 F (36.6 C) (Oral)   Resp 12   Wt 69.9 kg   SpO2 100%   BMI 21.49 kg/m   Physical Exam Vitals signs and nursing note reviewed.  Constitutional:      Appearance: He is well-developed.  HENT:     Head: Normocephalic and atraumatic.     Comments: Moist mucous membranes Eyes:     General: No visual field deficit or scleral icterus.       Right eye: No discharge.        Left eye: No discharge.     Conjunctiva/sclera: Conjunctivae normal.     Pupils: Pupils are equal, round, and reactive to light.  Neck:     Musculoskeletal: Normal range of motion.     Vascular: No JVD.     Trachea: No tracheal deviation.  Pulmonary:      Effort: Pulmonary effort is normal.     Breath sounds: No stridor.  Abdominal:     General: There is no distension.     Palpations: Abdomen is soft.     Tenderness: There is no abdominal tenderness.  Neurological:     Mental Status: He is alert and oriented to person, place, and time.     GCS: GCS eye subscore is 4. GCS verbal subscore is 5. GCS motor subscore is 6.     Cranial Nerves: No cranial nerve deficit or facial asymmetry.     Sensory: No sensory deficit.     Coordination: Coordination normal.  Psychiatric:        Mood and Affect: Mood normal.        Speech: Speech normal.        Behavior: Behavior normal.        Thought Content: Thought content normal.        Judgment: Judgment normal.      ED Treatments / Results  Labs (all labs ordered are listed, but only abnormal results are displayed) Labs Reviewed - No data to display  EKG None  Radiology No results found.  Procedures Procedures (including critical care time)  Medications Ordered in ED Medications  ondansetron (ZOFRAN-ODT) disintegrating tablet 4 mg (4 mg Oral Given 01/27/19 1133)     Initial Impression / Assessment and Plan / ED Course  I have reviewed the triage vital signs and the nursing notes.  Pertinent labs & imaging results that were available during my care of the patient were reviewed by me and considered in my medical decision making (see chart for details).        28 year old male presents today with fatigue.  Patient likely having some depressive symptoms.  These do not here to be severe, he has no suicidal ideations.  Patient will be referred to outpatient behavioral health for counseling services as needed.  Patient slightly nauseous with drinking water he will be given Zofran here.  He has no associated abdominal pain fever vomiting or any systemic symptoms.  Patient does have moist mucous membranes reassuring vital signs low suspicion for significant dehydration.  She did have  headache this morning this was brief, nonsevere with no associated neurological complaints presently.  No headache presently.  Low suspicion for acute intracranial abnormality.  Patient is tolerating p.o. encouraged to increase oral intake, return immediately if develops any  new or worsening signs or symptoms.  Verbalized understanding and agreement to this plan had no further questions or concerns.  Final Clinical Impressions(s) / ED Diagnoses   Final diagnoses:  Acute nonintractable headache, unspecified headache type    ED Discharge Orders         Ordered    ondansetron (ZOFRAN) 4 MG tablet  Every 6 hours     01/27/19 1211           Eyvonne Mechanic, PA-C 01/27/19 1327    Benjiman Core, MD 01/27/19 1534

## 2019-01-27 NOTE — ED Notes (Signed)
Patient verbalizes understanding of discharge instructions. Opportunity for questioning and answers were provided. Armband removed by staff, pt discharged from ED.  

## 2019-01-27 NOTE — ED Notes (Signed)
Patient reports he has not been feeling like eating or drinking much since around mother's day, states he lost his mother last year around this time and recently lost his father. He denies SI but reports he has just not been feeling like himself. Patient denies any n/v/d, chest pain, sob, fevers or chills. No recent travel.

## 2019-01-27 NOTE — ED Triage Notes (Signed)
Pt in with c/o HA and L side pain x few days. Denies any fevers, sob or cp

## 2019-01-27 NOTE — Discharge Instructions (Addendum)
Please read attached information. If you experience any new or worsening signs or symptoms please return to the emergency room for evaluation. Please follow-up with your primary care provider or specialist as discussed. Please use medication prescribed only as directed and discontinue taking if you have any concerning signs or symptoms.   °

## 2019-03-02 ENCOUNTER — Telehealth (HOSPITAL_BASED_OUTPATIENT_CLINIC_OR_DEPARTMENT_OTHER): Payer: Self-pay | Admitting: Nurse Practitioner

## 2019-03-02 ENCOUNTER — Encounter: Payer: Self-pay | Admitting: Nurse Practitioner

## 2019-03-02 DIAGNOSIS — K219 Gastro-esophageal reflux disease without esophagitis: Secondary | ICD-10-CM

## 2019-03-02 MED ORDER — SUCRALFATE 1 G PO TABS: ORAL_TABLET | ORAL | 2 refills | Status: AC

## 2019-03-02 MED ORDER — OMEPRAZOLE 40 MG PO CPDR: 40.0000 mg | DELAYED_RELEASE_CAPSULE | Freq: Every day | ORAL | 3 refills | Status: DC

## 2019-03-02 NOTE — Progress Notes (Signed)
Virtual Visit via Telephone Note Due to national recommendations of social distancing due to [facility], [facility] visit is felt to be most appropriate for this patient at this time.  I discussed the limitations, risks, security and privacy concerns of performing an evaluation and management service by telephone and the availability of in person appointments. I also discussed with the patient that there may be a patient responsible charge related to this service. The patient expressed understanding and agreed to proceed.    I connected with Sean Park on 03/02/19  at   4:10 PM EDT  EDT by telephone and verified that I am speaking with the correct person using two identifiers.   Consent I discussed the limitations, risks, security and privacy concerns of performing an evaluation and management service by [facility] and the availability of in person appointments. I also discussed with the patient that there may be a patient responsible charge related to this service. The patient expressed understanding and agreed to proceed.   Location of Patient: Private LOCATION   Location of Provider: [place] and [place] participating in Telemedicine visit: Geryl Rankins FNP-BC [place]    History of Present Illness: Telemedicine visit for: Cough States he is feeling a little down today. Dealing with the deaths of his mother and father with mother's day recently and now father's day coming up. He declines SSRI. States fiance is very supportive and he plans to be with family members on Sunday. Declines any thoughts of self harm.   Cough: Patient complains of productive cough with sputum described as blood streaked.  Symptoms began a few months ago.  The cough is without wheezing, dyspnea or hemoptysis and is aggravated by nothing Associated symptoms include:NONE. Patient does not have new pets. Patient does not have a history of asthma. Patient does not have a  history of environmental allergens. Patient has not had recent travel. Patient does not smoke cigarettes but he does smoke marijuana. Patient  does have previous Chest X-ray. He has a history of GERD and has not been taking omeprazole.      Past Medical History:  Diagnosis Date  . Anemia   . Anxiety   . Bronchitis   . Dysphagia   . GERD (gastroesophageal reflux disease)   . Muscle spasm   . Rotator cuff tear     Past Surgical History:  Procedure Laterality Date  . COLONOSCOPY      Family History  Problem Relation Age of Onset  . Diabetes Father   . Anxiety disorder Father   . Brain cancer Father   . Breast cancer Mother   . Crohn's disease Brother   . Schizophrenia Cousin   . Depression Maternal Aunt   . Dementia Maternal Grandmother   . Colon cancer Neg Hx   . Esophageal cancer Neg Hx   . Stomach cancer Neg Hx   . Rectal cancer Neg Hx     Social History   Socioeconomic History  . Marital status: Single    Spouse name: Not on file  . Number of children: 0  . Years of education: Not on file  . Highest education level: Not on file  Occupational History  . Occupation: Radio broadcast assistant  Social Needs  . Financial resource strain: Not on file  . Food insecurity    Worry: Not on file    Inability: Not on file  . Transportation needs    Medical: Not on file  Non-medical: Not on file  Tobacco Use  . Smoking status: Former Smoker    Types: Cigarettes    Quit date: 09/15/2014    Years since quitting: 4.4  . Smokeless tobacco: Never Used  . Tobacco comment: Smoke Marijuana   Substance and Sexual Activity  . Alcohol use: Yes    Alcohol/week: 0.0 standard drinks    Comment: occassionally  . Drug use: Yes    Types: Marijuana  . Sexual activity: Yes  Lifestyle  . Physical activity    Days per week: Not on file    Minutes per session: Not on file  . Stress: Not on file  Relationships  . Social Musicianconnections    Talks on phone: Not on file    Gets together: Not on  file    Attends religious service: Not on file    Active member of club or organization: Not on file    Attends meetings of clubs or organizations: Not on file    Relationship status: Not on file  Other Topics Concern  . Not on file  Social History Narrative  . Not on file     Observations/Objective: Awake, alert and oriented x 3   Review of Systems  Constitutional: Negative for fever, malaise/fatigue and weight loss.  HENT: Negative.  Negative for nosebleeds.   Eyes: Negative.  Negative for blurred vision, double vision and photophobia.  Respiratory: Positive for cough. Negative for shortness of breath.   Cardiovascular: Negative.  Negative for chest pain, palpitations and leg swelling.  Gastrointestinal: Positive for heartburn. Negative for nausea and vomiting.  Musculoskeletal: Negative.  Negative for myalgias.  Neurological: Negative.  Negative for dizziness, focal weakness, seizures and headaches.  Psychiatric/Behavioral: Positive for depression. Negative for suicidal ideas. The patient has insomnia.     Assessment and Plan: Diagnoses and all orders for this visit:  Gastroesophageal reflux disease without esophagitis -     omeprazole (PRILOSEC) 40 MG capsule; Take 1 capsule (40 mg total) by mouth daily. Please mail -     sucralfate (CARAFATE) 1 g tablet; 1-2 tablets by mouth daily as needed. Please mail I have instructed him to have COVID testing performed at one of the many free locations in the area. He is low risk however would not be a bad idea to rule out. Will also restart omeprazole for possible poorly controlled asthma    Follow Up Instructions Return if symptoms worsen or fail to improve.     I discussed the assessment and treatment plan with the patient. The patient was provided an opportunity to ask questions and all were answered. The patient agreed with the plan and demonstrated an understanding of the instructions.   The patient was advised to call back or  seek an in-person evaluation if the symptoms worsen or if the condition fails to improve as anticipated.  I provided 18 minutes of non-face-to-face time during this encounter including median intraservice time, reviewing previous notes, labs, imaging, medications and explaining diagnosis and management.  Claiborne RiggZelda W Esiquio Boesen, FNP-BC

## 2019-03-03 MED FILL — SUCRALFATE 1 GM TABLET: 1 | 20 days supply | Qty: 40 | Fill #0

## 2019-03-03 MED FILL — OMEPRAZOLE DR 40 MG CAPSULE: 40 | 30 days supply | Qty: 30 | Fill #0

## 2019-06-18 ENCOUNTER — Emergency Department (HOSPITAL_COMMUNITY)
Admission: EM | Admit: 2019-06-18 | Discharge: 2019-06-18 | Disposition: A | Discharge status: Home / Self Care | Payer: Self-pay | Attending: Emergency Medicine | Admitting: Emergency Medicine

## 2019-06-18 ENCOUNTER — Other Ambulatory Visit: Payer: Self-pay

## 2019-06-18 ENCOUNTER — Emergency Department (HOSPITAL_COMMUNITY): Payer: Self-pay

## 2019-06-18 ENCOUNTER — Encounter (HOSPITAL_COMMUNITY): Payer: Self-pay | Admitting: Emergency Medicine

## 2019-06-18 DIAGNOSIS — K219 Gastro-esophageal reflux disease without esophagitis: Secondary | ICD-10-CM

## 2019-06-18 DIAGNOSIS — Z87891 Personal history of nicotine dependence: Secondary | ICD-10-CM | POA: Insufficient documentation

## 2019-06-18 DIAGNOSIS — Z79899 Other long term (current) drug therapy: Secondary | ICD-10-CM | POA: Insufficient documentation

## 2019-06-18 DIAGNOSIS — R042 Hemoptysis: Secondary | ICD-10-CM | POA: Insufficient documentation

## 2019-06-18 MED ORDER — OMEPRAZOLE 40 MG PO CPDR: 40.0000 mg | DELAYED_RELEASE_CAPSULE | Freq: Every day | ORAL | 0 refills | Status: AC

## 2019-06-18 NOTE — ED Triage Notes (Signed)
Pt arrives from home today complaining of blood in sputum. Pt states he has noticed it for a while but today was worse than normal.

## 2019-06-18 NOTE — Discharge Instructions (Addendum)
Please take your GERD medication as prescribed Follow up with your PCP regarding your ED visit today Your symptoms are likely related to throat irritation  Return to the ED immediately for any worsening symptoms including coughing up blood, chest pain, shortness of breath, vomiting, vomiting blood

## 2019-06-18 NOTE — ED Provider Notes (Signed)
[facility] EMERGENCY DEPARTMENT Provider Note   CSN: [id number] Arrival date & time: 06/18/19  1428     History   Chief Complaint Chief Complaint  Patient presents with  . Hemoptysis    Blood tinged sputum    HPI Sean Park is a 28 y.o. male with PMHx GERD who presents to the ED today complaining of blood tinged sputum that occurs every morning when he clears his throat. Pt reports that when he wakes up in the mornings he clears his throat to get the sputum out and it has been blood tinged for "Awhile" but was worse this AM, causing him concern. Denies any fever, chills, dental problems, sore throat, chest pain, shortness of breath, cough, nausea, vomiting, or any other associated symptoms.        Past Medical History:  Diagnosis Date  . Anemia   . Anxiety   . Bronchitis   . Dysphagia   . GERD (gastroesophageal reflux disease)   . Muscle spasm   . Rotator cuff tear     Patient Active Problem List   Diagnosis Date Noted  . Rotator cuff tendinitis, left 04/01/2017  . Scapular dyskinesis 04/01/2017  . Dyshidrotic eczema 02/05/2017  . Right hand pain 12/25/2016  . Impingement syndrome of left shoulder 04/13/2015  . Anxiety and depression 03/13/2015  . GERD (gastroesophageal reflux disease) 03/02/2015    Past Surgical History:  Procedure Laterality Date  . COLONOSCOPY          Home Medications    Prior to Admission medications   Medication Sig Start Date End Date Taking? Authorizing Provider  Multiple Vitamin (MULTIVITAMIN WITH MINERALS) TABS tablet Take 1 tablet by mouth daily.    [provider]  omeprazole (PRILOSEC) 40 MG capsule Take 1 capsule (40 mg total) by mouth daily. Please mail 06/18/19   Eustaquio Maize, PA-C  ondansetron (ZOFRAN) 4 MG tablet Take 1 tablet (4 mg total) by mouth every 6 (six) hours. 01/27/19   Hedges, Dellis Filbert, PA-C  sucralfate (CARAFATE) 1 g tablet 1-2 tablets by mouth daily as needed. Please mail 03/02/19    Gildardo Pounds, NP    Family History Family History  Problem Relation Age of Onset  . Diabetes Father   . Anxiety disorder Father   . Brain cancer Father   . Breast cancer Mother   . Crohn's disease Brother   . Schizophrenia Cousin   . Depression Maternal Aunt   . Dementia Maternal Grandmother   . Colon cancer Neg Hx   . Esophageal cancer Neg Hx   . Stomach cancer Neg Hx   . Rectal cancer Neg Hx     Social History Social History   Tobacco Use  . Smoking status: Former Smoker    Types: Cigarettes    Quit date: 09/15/2014    Years since quitting: 4.7  . Smokeless tobacco: Never Used  . Tobacco comment: Smoke Marijuana   Substance Use Topics  . Alcohol use: Yes    Alcohol/week: 0.0 standard drinks    Comment: occassionally  . Drug use: Yes    Types: Marijuana     Allergies   Patient has no known allergies.   Review of Systems Review of Systems  Constitutional: Negative for chills and fever.  HENT:       + blood tinged sputum  Respiratory: Negative for cough and shortness of breath.   Cardiovascular: Negative for chest pain.  Gastrointestinal: Negative for nausea and vomiting.  Physical Exam Updated Vital Signs BP 120/73 (BP Location: Right Arm)   Pulse 78   Temp 98.4 F (36.9 C) (Oral)   Resp 16   Ht 5\' 11"  (1.803 m)   Wt 68 kg   SpO2 98%   BMI 20.92 kg/m   Physical Exam Vitals signs and nursing note reviewed.  Constitutional:      Appearance: He is not ill-appearing.  HENT:     Head: Normocephalic and atraumatic.     Mouth/Throat:     Mouth: Mucous membranes are moist.     Pharynx: No oropharyngeal exudate or posterior oropharyngeal erythema.  Eyes:     Conjunctiva/sclera: Conjunctivae normal.  Neck:     Musculoskeletal: Normal range of motion. No muscular tenderness.  Cardiovascular:     Rate and Rhythm: Normal rate and regular rhythm.     Pulses: Normal pulses.  Pulmonary:     Effort: Pulmonary effort is normal.     Breath  sounds: Normal breath sounds. No wheezing, rhonchi or rales.  Abdominal:     General: Abdomen is flat.     Tenderness: There is no abdominal tenderness. There is no guarding or rebound.  Lymphadenopathy:     Cervical: No cervical adenopathy.  Skin:    General: Skin is warm and dry.     Coloration: Skin is not jaundiced.  Neurological:     Mental Status: He is alert.      ED Treatments / Results  Labs (all labs ordered are listed, but only abnormal results are displayed) Labs Reviewed - No data to display  EKG None  Radiology Dg Chest 2 View  Result Date: 06/18/2019 CLINICAL DATA:  Blood tinged sputum two months. EXAM: CHEST - 2 VIEW COMPARISON:  10/10/2018 FINDINGS: Lungs are adequately inflated and otherwise clear. Cardiomediastinal silhouette, bones and soft tissues are normal. IMPRESSION: No active cardiopulmonary disease. Electronically Signed   By: 10/12/2018 M.D.   On: 06/18/2019 16:44    Procedures Procedures (including critical care time)  Medications Ordered in ED Medications - No data to display   Initial Impression / Assessment and Plan / ED Course  I have reviewed the triage vital signs and the nursing notes.  Pertinent labs & imaging results that were available during my care of the patient were reviewed by me and considered in my medical decision making (see chart for details).    28 year old male who presents to the ED complaining of blood tinged sputum after clearing his throat for "awhile" but worse today.  Patient appears nontoxic today.  He is afebrile without tachycardia or tachypnea.  She does not smoke cigarettes but does endorse smoking marijuana.  He has a history of GERD and states he has been out of his omeprazole for some time.  Denies coughing, fever, chills, shortness of breath, nausea, vomiting.  Skull exam is reassuring and lungs are clear to auscultation bilaterally.  He has no tenderness to his neck. His posterior oropharynx is clear and  moist.  Uvula is midline.  Patient is having some throat irritation from clearing his throat in the mornings.  He is PERC negative today.  Will obtain chest x-ray today to rule out any infection including tuberculosis.  If negative patient to be discharged home with PCP follow-up.   CXR negative at this time. Will discharge pt home at this time with suspected throat irritation causing the blood tinged sputum from clearing his throat aggressively in the mornings. Advised to follow up with  his PCP. I have represcribed his omeprazole as it appears he has been out for sometime. Strict return precautions have been discussed with him. He is in agreement with plan at this time and stable for discharge home.   This note was prepared using Dragon voice recognition software and may include unintentional dictation errors due to the inherent limitations of voice recognition software.      Final Clinical Impressions(s) / ED Diagnoses   Final diagnoses:  Blood-tinged sputum    ED Discharge Orders         Ordered    omeprazole (PRILOSEC) 40 MG capsule  Daily     06/18/19 1536           Tanda RockersVenter, Manreet Kiernan, PA-C 06/18/19 1654    Terald Sleeperrifan, Matthew J, MD 06/18/19 2342

## 2019-10-07 ENCOUNTER — Ambulatory Visit: Payer: Self-pay | Attending: Family Medicine | Admitting: Family Medicine

## 2019-10-07 ENCOUNTER — Encounter: Payer: Self-pay | Admitting: Family Medicine

## 2019-10-07 ENCOUNTER — Other Ambulatory Visit: Payer: Self-pay

## 2019-10-07 VITALS — BP 112/66 | HR 80 | Ht 71.0 in | Wt 151.0 lb

## 2019-10-07 DIAGNOSIS — G4452 New daily persistent headache (NDPH): Secondary | ICD-10-CM

## 2019-10-07 DIAGNOSIS — J310 Chronic rhinitis: Secondary | ICD-10-CM

## 2019-10-07 DIAGNOSIS — H6692 Otitis media, unspecified, left ear: Secondary | ICD-10-CM

## 2019-10-07 MED ORDER — CETIRIZINE HCL 10 MG PO TABS: 10.0000 mg | ORAL_TABLET | Freq: Every day | ORAL | 3 refills | Status: AC

## 2019-10-07 MED ORDER — AMOXICILLIN-POT CLAVULANATE 500-125 MG PO TABS: 1.0000 | ORAL_TABLET | Freq: Two times a day (BID) | ORAL | 0 refills | Status: AC

## 2019-10-07 MED FILL — AMOX-CLAV 500-125 MG TABLET: 500-125 | 7 days supply | Qty: 14 | Fill #0

## 2019-10-07 NOTE — Progress Notes (Signed)
States that he feels pressure in his head. Pain travels from different sides to back of head.

## 2019-10-07 NOTE — Progress Notes (Signed)
Established Patient Office Visit  Subjective:  Patient ID: Sean Park, male    DOB: [birth year]  Age: 29 y.o. MRN: [id number]  CC:  Chief Complaint  Patient presents with  . Headache    HPI [place], 29 year old male, who presents secondary to complaint of greater than 1 week of headache which feels as if it is a pressure sensation and occurs at the temples or across his forehead and sometimes feels as if there is pressure on the left side of the head.  Headaches have now become daily.  Discomfort is about a 3-4 on a 0-to-10 scale.  He denies any issues with sensitivity to light or noise during headaches and no nausea with headaches.  No personal history of migraines but mother has history of migraines.  He does have some nasal congestion and sensation of postnasal drainage.  He has also noticed some discomfort in the left ear.  He denies any fever chills and he has had no shortness of breath or cough.  He denies any significant dizziness.  Past Medical History:  Diagnosis Date  . Anemia   . Anxiety   . Bronchitis   . Dysphagia   . GERD (gastroesophageal reflux disease)   . Muscle spasm   . Rotator cuff tear     Past Surgical History:  Procedure Laterality Date  . COLONOSCOPY      Family History  Problem Relation Age of Onset  . Diabetes Father   . Anxiety disorder Father   . Brain cancer Father   . Breast cancer Mother   . Crohn's disease Brother   . Schizophrenia Cousin   . Depression Maternal Aunt   . Dementia Maternal Grandmother   . Colon cancer Neg Hx   . Esophageal cancer Neg Hx   . Stomach cancer Neg Hx   . Rectal cancer Neg Hx     Social History   Socioeconomic History  . Marital status: Single    Spouse name: Not on file  . Number of children: 0  . Years of education: Not on file  . Highest education level: Not on file  Occupational History  . Occupation: Sales executive  Tobacco Use  . Smoking status: Former Smoker    Types: Cigarettes   Quit date: 09/15/2014    Years since quitting: 5.0  . Smokeless tobacco: Never Used  . Tobacco comment: Smoke Marijuana   Substance and Sexual Activity  . Alcohol use: Yes    Alcohol/week: 0.0 standard drinks    Comment: occassionally  . Drug use: Yes    Types: Marijuana  . Sexual activity: Yes  Other Topics Concern  . Not on file  Social History Narrative  . Not on file   Social Determinants of Health   Financial Resource Strain:   . Difficulty of Paying Living Expenses: Not on file  Food Insecurity:   . Worried About Programme researcher, broadcasting/film/video in the Last Year: Not on file  . Ran Out of Food in the Last Year: Not on file  Transportation Needs:   . Lack of Transportation (Medical): Not on file  . Lack of Transportation (Non-Medical): Not on file  Physical Activity:   . Days of Exercise per Week: Not on file  . Minutes of Exercise per Session: Not on file  Stress:   . Feeling of Stress : Not on file  Social Connections:   . Frequency of Communication with Friends and Family: Not on file  . Frequency of Social  Gatherings with Friends and Family: Not on file  . Attends Religious Services: Not on file  . Active Member of Clubs or Organizations: Not on file  . Attends Banker Meetings: Not on file  . Marital Status: Not on file  Intimate Partner Violence:   . Fear of Current or Ex-Partner: Not on file  . Emotionally Abused: Not on file  . Physically Abused: Not on file  . Sexually Abused: Not on file    Outpatient Medications Prior to Visit  Medication Sig Dispense Refill  . Multiple Vitamin (MULTIVITAMIN WITH MINERALS) TABS tablet Take 1 tablet by mouth daily.    Marland Kitchen omeprazole (PRILOSEC) 40 MG capsule Take 1 capsule (40 mg total) by mouth daily. Please mail (Patient not taking: Reported on 10/07/2019) 30 capsule 0  . ondansetron (ZOFRAN) 4 MG tablet Take 1 tablet (4 mg total) by mouth every 6 (six) hours. (Patient not taking: Reported on 10/07/2019) 12 tablet 0  .  sucralfate (CARAFATE) 1 g tablet 1-2 tablets by mouth daily as needed. Please mail (Patient not taking: Reported on 10/07/2019) 40 tablet 2   No facility-administered medications prior to visit.    No Known Allergies  ROS Review of Systems  Constitutional: Negative for chills, fatigue and fever.  HENT: Positive for congestion and postnasal drip. Negative for nosebleeds, rhinorrhea, sinus pain, sore throat and trouble swallowing.   Eyes: Negative for photophobia and visual disturbance.  Respiratory: Negative for cough and shortness of breath.   Cardiovascular: Negative for chest pain, palpitations and leg swelling.  Gastrointestinal: Negative for abdominal pain, constipation, diarrhea and nausea.  Endocrine: Negative for polydipsia, polyphagia and polyuria.  Genitourinary: Negative for dysuria and frequency.  Musculoskeletal: Negative for arthralgias and back pain.  Neurological: Positive for headaches. Negative for dizziness.  Hematological: Negative for adenopathy. Does not bruise/bleed easily.      Objective:    Physical Exam  Constitutional: He is oriented to person, place, and time. He appears well-developed and well-nourished.  Well-nourished well-developed male in no acute distress, patient wearing mask as per office COVID-19 protocol which he removed for portion of exam  HENT:  Right Ear: Hearing, external ear and ear canal normal.  Left Ear: Hearing, external ear and ear canal normal. Tympanic membrane is erythematous.  Nose: Mucosal edema and rhinorrhea present.  Mouth/Throat: Posterior oropharyngeal edema and posterior oropharyngeal erythema present.  Right TM light pink but with visible landmarks, left TM dark pink, slightly thickened and no visible landmarks; edema/erythema of the nasal turbinates with mild clear nasal drainage; patient with marked posterior pharynx cobblestoning with edema/erythema the posterior pharynx  Eyes: Conjunctivae and EOM are normal.  Neck: No  JVD present.  Cardiovascular: Normal rate and regular rhythm.  Pulmonary/Chest: Effort normal and breath sounds normal.  Abdominal: Soft. There is no abdominal tenderness. There is no rebound and no guarding.  Musculoskeletal:     Cervical back: Normal range of motion and neck supple.  Lymphadenopathy:    He has no cervical adenopathy.  Neurological: He is alert and oriented to person, place, and time. No cranial nerve deficit.  Skin: No rash noted. No erythema.  Psychiatric: He has a normal mood and affect. His behavior is normal.  Nursing note and vitals reviewed.   BP 112/66   Pulse 80   Ht 5\' 11"  (1.803 m)   Wt 151 lb (68.5 kg)   SpO2 99%   BMI 21.06 kg/m  Wt Readings from Last 3 Encounters:  10/07/19 151 lb (  68.5 kg)  06/18/19 150 lb (68 kg)  01/27/19 154 lb 1.6 oz (69.9 kg)     Health Maintenance Due  Topic Date Due  . HIV Screening  08/21/2004  . INFLUENZA VACCINE  04/16/2019     Lab Results  Component Value Date   TSH 0.613 03/13/2015   Lab Results  Component Value Date   WBC 4.2 10/10/2018   HGB 13.2 10/10/2018   HCT 44.0 10/10/2018   MCV 77.1 (L) 10/10/2018   PLT 225 10/10/2018   Lab Results  Component Value Date   NA 140 12/03/2018   K 4.0 12/03/2018   CO2 25 12/03/2018   GLUCOSE 66 12/03/2018   BUN 8 12/03/2018   CREATININE 0.85 12/03/2018   BILITOT 1.2 10/10/2018   ALKPHOS 38 10/10/2018   AST 29 10/10/2018   ALT 18 10/10/2018   PROT 7.2 10/10/2018   ALBUMIN 3.9 10/10/2018   CALCIUM 9.2 12/03/2018   ANIONGAP 13 10/10/2018   Lab Results  Component Value Date   CHOL 134 07/08/2017   Lab Results  Component Value Date   HDL 51 07/08/2017   Lab Results  Component Value Date   LDLCALC 76 07/08/2017   Lab Results  Component Value Date   TRIG 35 07/08/2017   Lab Results  Component Value Date   CHOLHDL 2.6 07/08/2017   Lab Results  Component Value Date   HGBA1C 4.90 10/12/2015      Assessment & Plan:  1. New daily  persistent headache Patient with complaint of new daily headaches which are pressure sensation and located frontal or temporal he.  Suspect that his headaches are related to his nasal congestion and postnasal drainage.  Prescription provided for cetirizine to take prior to bedtime to help with postnasal drainage and congestion.  Blood pressure is normal at today's visit so therefore not likely to be contributing to current headaches.  He should return in 1 to 2 weeks if his headaches have not improved.  Go to the emergency department for any acute increase in headache/focal numbness or weakness, or any concerns. - cetirizine (ZYRTEC) 10 MG tablet; Take 1 tablet (10 mg total) by mouth at bedtime. X 7 days then as needed  Dispense: 30 tablet; Refill: 3  2. Left otitis media, unspecified otitis media type Left otitis media on exam.  Prescription Augmentin 500 mg twice daily x7 days to take after eating.  Call or return if worsening ear pain or any concerns. - amoxicillin-clavulanate (AUGMENTIN) 500-125 MG tablet; Take 1 tablet (500 mg total) by mouth 2 (two) times daily for 7 days.  Dispense: 14 tablet; Refill: 0 - cetirizine (ZYRTEC) 10 MG tablet; Take 1 tablet (10 mg total) by mouth at bedtime. X 7 days then as needed  Dispense: 30 tablet; Refill: 3  3. Chronic rhinitis On review of chart, patient is status post emergency department visit last fall for coughing up blood-tinged sputum.  Discussed with the patient that I suspect that he may actually have had purulent postnasal drainage with irritation of the nasal passages that he did not collect back up this patient reports no additional coughing but on exam has evidence of chronic appearing cobblestoning/erythema of the posterior pharynx.  Prescription provided for cetirizine to help with postnasal drainage and congestion.  Congestion may also be contributing to his headaches has been asked to return to clinic if his headache is not improving in the next 1  to 2 weeks.  Meds ordered this encounter  Medications  .  amoxicillin-clavulanate (AUGMENTIN) 500-125 MG tablet    Sig: Take 1 tablet (500 mg total) by mouth 2 (two) times daily for 7 days.    Dispense:  14 tablet    Refill:  0  . cetirizine (ZYRTEC) 10 MG tablet    Sig: Take 1 tablet (10 mg total) by mouth at bedtime. X 7 days then as needed    Dispense:  30 tablet    Refill:  3    Follow-up: Return for headache: 1-2 weeks if not better.    Cain Saupe, MD

## 2021-01-13 ENCOUNTER — Other Ambulatory Visit: Payer: Self-pay

## 2021-01-13 ENCOUNTER — Emergency Department (HOSPITAL_COMMUNITY)
Admission: EM | Admit: 2021-01-13 | Discharge: 2021-01-13 | Disposition: A | Discharge status: Home / Self Care | Payer: Self-pay | Attending: Emergency Medicine | Admitting: Emergency Medicine

## 2021-01-13 ENCOUNTER — Emergency Department (HOSPITAL_COMMUNITY): Payer: Self-pay

## 2021-01-13 DIAGNOSIS — M79642 Pain in left hand: Secondary | ICD-10-CM | POA: Insufficient documentation

## 2021-01-13 DIAGNOSIS — W231XXA Caught, crushed, jammed, or pinched between stationary objects, initial encounter: Secondary | ICD-10-CM | POA: Insufficient documentation

## 2021-01-13 DIAGNOSIS — M545 Low back pain, unspecified: Secondary | ICD-10-CM | POA: Insufficient documentation

## 2021-01-13 DIAGNOSIS — Z87891 Personal history of nicotine dependence: Secondary | ICD-10-CM | POA: Insufficient documentation

## 2021-01-13 MED ORDER — CYCLOBENZAPRINE HCL 10 MG PO TABS
10.0000 mg | ORAL_TABLET | Freq: Two times a day (BID) | ORAL | 0 refills | Status: DC | PRN
  Filled 2021-01-13: qty 20, 10d supply, fill #0

## 2021-01-13 MED ORDER — CYCLOBENZAPRINE HCL 10 MG PO TABS: 10.0000 mg | ORAL_TABLET | Freq: Two times a day (BID) | ORAL | 0 refills | Status: AC | PRN

## 2021-01-13 MED ORDER — IBUPROFEN 800 MG PO TABS
800.0000 mg | ORAL_TABLET | Freq: Once | ORAL | Status: AC
  Administered 2021-01-13: 800 mg via ORAL
  Filled 2021-01-13: qty 1
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
## 2021-01-13 NOTE — Discharge Instructions (Signed)
Take Tylenol or Motrin for pain control.  Can also try the prescribed muscle relaxer.  Recommend following up with your primary care doctor for recheck next week.

## 2021-01-14 ENCOUNTER — Other Ambulatory Visit: Payer: Self-pay

## 2021-03-17 ENCOUNTER — Emergency Department (HOSPITAL_COMMUNITY): Payer: No Typology Code available for payment source

## 2021-03-17 ENCOUNTER — Other Ambulatory Visit: Payer: Self-pay

## 2021-03-17 ENCOUNTER — Encounter (HOSPITAL_COMMUNITY): Payer: Self-pay | Admitting: Emergency Medicine

## 2021-03-17 ENCOUNTER — Emergency Department (HOSPITAL_COMMUNITY)
Admission: EM | Admit: 2021-03-17 | Discharge: 2021-03-17 | Discharge status: Against Medical Advice | Payer: No Typology Code available for payment source | Attending: Emergency Medicine | Admitting: Emergency Medicine

## 2021-03-17 DIAGNOSIS — M545 Low back pain, unspecified: Secondary | ICD-10-CM | POA: Insufficient documentation

## 2021-03-17 DIAGNOSIS — Y92410 Unspecified street and highway as the place of occurrence of the external cause: Secondary | ICD-10-CM | POA: Diagnosis not present

## 2021-03-17 DIAGNOSIS — Z87891 Personal history of nicotine dependence: Secondary | ICD-10-CM | POA: Diagnosis not present

## 2021-03-17 MED ORDER — NAPROXEN 500 MG PO TABS: 500.0000 mg | ORAL_TABLET | Freq: Two times a day (BID) | ORAL | 0 refills | Status: DC

## 2021-03-17 MED ORDER — METHOCARBAMOL 500 MG PO TABS: 500.0000 mg | ORAL_TABLET | Freq: Two times a day (BID) | ORAL | 0 refills | Status: AC
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
## 2021-03-21 ENCOUNTER — Ambulatory Visit: Payer: Self-pay | Admitting: *Deleted
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
## 2021-03-21 NOTE — Telephone Encounter (Signed)
Noted  

## 2022-01-10 ENCOUNTER — Emergency Department (HOSPITAL_BASED_OUTPATIENT_CLINIC_OR_DEPARTMENT_OTHER)
Admission: EM | Admit: 2022-01-10 | Discharge: 2022-01-10 | Disposition: A | Discharge status: Home / Self Care | Payer: Self-pay | Attending: Emergency Medicine | Admitting: Emergency Medicine

## 2022-01-10 ENCOUNTER — Encounter (HOSPITAL_BASED_OUTPATIENT_CLINIC_OR_DEPARTMENT_OTHER): Payer: Self-pay | Admitting: Emergency Medicine

## 2022-01-10 ENCOUNTER — Other Ambulatory Visit: Payer: Self-pay

## 2022-01-10 DIAGNOSIS — J039 Acute tonsillitis, unspecified: Secondary | ICD-10-CM | POA: Insufficient documentation

## 2022-01-10 LAB — GROUP A STREP BY PCR: Group A Strep by PCR: NOT DETECTED

## 2022-01-10 MED ORDER — PREDNISONE 20 MG PO TABS
20.0000 mg | ORAL_TABLET | Freq: Once | ORAL | Status: AC
  Administered 2022-01-10: 20 mg via ORAL
  Filled 2022-01-10: qty 1

## 2022-01-10 MED ORDER — CEPHALEXIN 500 MG PO CAPS: 500.0000 mg | ORAL_CAPSULE | Freq: Four times a day (QID) | ORAL | 0 refills | Status: AC

## 2022-01-10 MED ORDER — CEPHALEXIN 250 MG PO CAPS
500.0000 mg | ORAL_CAPSULE | Freq: Once | ORAL | Status: AC
  Administered 2022-01-10: 500 mg via ORAL
  Filled 2022-01-10: qty 2

## 2022-01-10 MED ORDER — PREDNISONE 10 MG PO TABS: 20.0000 mg | ORAL_TABLET | Freq: Two times a day (BID) | ORAL | 0 refills | Status: AC

## 2022-01-10 NOTE — ED Triage Notes (Signed)
?  Patient comes in with sore throat that has been going on for a couple days.  Patient states it started becoming harder to swallow earlier tonight so he wanted to be evaluated.  No fevers at home.  No OTC meds.  Pain 8/10 sharp pain when swallowing. ?

## 2022-01-10 NOTE — ED Provider Notes (Signed)
?[facility] EMERGENCY DEPT ?Provider Note ? ? ?CSN: [id number] ?Arrival date & time: 01/10/22  7062 ? ?  ? ?History ? ?Chief Complaint  ?Patient presents with  ? Sore Throat  ? ? ?Sean Park is a 31 y.o. male. ? ?Patient is a 31 year old male with no significant past medical history.  He presents with sore throat for the past 2 days.  He looked in the mirror and noticed white patches to the back of his throat.  He denies any fevers or chills.  He states that his girlfriend is having similar symptoms.  He denies any difficulty breathing.  Pain is worse with swallowing.  There are no alleviating factors. ? ?The history is provided by the patient.  ? ?  ? ?Home Medications ?Prior to Admission medications   ?Medication Sig Start Date End Date Taking? Authorizing Provider  ?cetirizine (ZYRTEC) 10 MG tablet Take 1 tablet (10 mg total) by mouth at bedtime. X 7 days then as needed 10/07/19   Fulp, Cammie, MD  ?cyclobenzaprine (FLEXERIL) 10 MG tablet Take 1 tablet (10 mg total) by mouth 2 (two) times daily as needed for muscle spasms. 01/13/21   Milagros Loll, MD  ?methocarbamol (ROBAXIN) 500 MG tablet Take 1 tablet (500 mg total) by mouth 2 (two) times daily. 03/17/21   Muthersbaugh, Dahlia Client, PA-C  ?Multiple Vitamin (MULTIVITAMIN WITH MINERALS) TABS tablet Take 1 tablet by mouth daily.    [provider]  ?naproxen (NAPROSYN) 500 MG tablet Take 1 tablet (500 mg total) by mouth 2 (two) times daily with a meal. 03/17/21   Muthersbaugh, Dahlia Client, PA-C  ?omeprazole (PRILOSEC) 40 MG capsule Take 1 capsule (40 mg total) by mouth daily. Please mail ?Patient not taking: Reported on 10/07/2019 06/18/19   Tanda Rockers, PA-C  ?ondansetron (ZOFRAN) 4 MG tablet Take 1 tablet (4 mg total) by mouth every 6 (six) hours. ?Patient not taking: Reported on 10/07/2019 01/27/19   Eyvonne Mechanic, PA-C  ?sucralfate (CARAFATE) 1 g tablet 1-2 tablets by mouth daily as needed. Please mail ?Patient not taking: Reported on 10/07/2019  03/02/19   Claiborne Rigg, NP  ?   ? ?Allergies    ?Patient has no known allergies.   ? ?Review of Systems   ?Review of Systems  ?All other systems reviewed and are negative. ? ?Physical Exam ?Updated Vital Signs ?BP 121/88 (BP Location: Right Arm)   Pulse 64   Temp 98.4 ?F (36.9 ?C) (Oral)   Resp 18   Ht 5\' 11"  (1.803 m)   Wt 68.5 kg   SpO2 98%   BMI 21.06 kg/m?  ?Physical Exam ?Vitals reviewed.  ?Constitutional:   ?   General: He is not in acute distress. ?   Appearance: He is well-developed. He is not ill-appearing.  ?HENT:  ?   Head: Normocephalic and atraumatic.  ?   Mouth/Throat:  ?   Mouth: Mucous membranes are moist.  ?   Pharynx: Oropharyngeal exudate and posterior oropharyngeal erythema present.  ?   Tonsils: Tonsillar exudate and tonsillar abscess present.  ?Neck:  ?   Thyroid: No thyromegaly.  ?Musculoskeletal:  ?   Cervical back: Normal range of motion.  ?Lymphadenopathy:  ?   Cervical: No cervical adenopathy.  ?Skin: ?   General: Skin is warm and dry.  ?Neurological:  ?   Mental Status: He is alert.  ? ? ?ED Results / Procedures / Treatments   ?Labs ?(all labs ordered are listed, but only abnormal results are displayed) ?Labs  Reviewed  ?GROUP A STREP BY PCR  ? ? ?EKG ?None ? ?Radiology ?No results found. ? ?Procedures ?Procedures  ? ? ?Medications Ordered in ED ?Medications - No data to display ? ?ED Course/ Medical Decision Making/ A&P ? ?Strep test is negative, but will treat with antibiotics due to exudative tonsillitis.  Patient wanted to be prescribed prednisone.  He is to follow-up as needed if not improving. ? ?Final Clinical Impression(s) / ED Diagnoses ?Final diagnoses:  ?None  ? ? ?Rx / DC Orders ?ED Discharge Orders   ? ? None  ? ?  ? ? ?  ?Geoffery Lyons, MD ?01/10/22 [phone] ? ?

## 2022-01-10 NOTE — Discharge Instructions (Signed)
Begin taking Keflex and prednisone as prescribed. ? ?Drink plenty of fluids and get plenty of rest. ? ?Return to the emergency department if symptoms significantly worsen or change. ?

## 2024-03-11 ENCOUNTER — Other Ambulatory Visit: Payer: Self-pay

## 2024-03-11 DIAGNOSIS — S8002XA Contusion of left knee, initial encounter: Secondary | ICD-10-CM | POA: Insufficient documentation

## 2024-03-11 DIAGNOSIS — W01198A Fall on same level from slipping, tripping and stumbling with subsequent striking against other object, initial encounter: Secondary | ICD-10-CM | POA: Insufficient documentation

## 2024-03-11 DIAGNOSIS — R791 Abnormal coagulation profile: Secondary | ICD-10-CM | POA: Insufficient documentation

## 2024-03-12 ENCOUNTER — Emergency Department (HOSPITAL_BASED_OUTPATIENT_CLINIC_OR_DEPARTMENT_OTHER): Payer: Self-pay

## 2024-03-12 ENCOUNTER — Emergency Department (HOSPITAL_BASED_OUTPATIENT_CLINIC_OR_DEPARTMENT_OTHER): Payer: Self-pay | Admitting: Radiology

## 2024-03-12 ENCOUNTER — Other Ambulatory Visit (HOSPITAL_BASED_OUTPATIENT_CLINIC_OR_DEPARTMENT_OTHER): Payer: Self-pay

## 2024-03-12 ENCOUNTER — Encounter (HOSPITAL_BASED_OUTPATIENT_CLINIC_OR_DEPARTMENT_OTHER): Payer: Self-pay | Admitting: Emergency Medicine

## 2024-03-12 ENCOUNTER — Emergency Department (HOSPITAL_BASED_OUTPATIENT_CLINIC_OR_DEPARTMENT_OTHER)
Admission: EM | Admit: 2024-03-12 | Discharge: 2024-03-12 | Disposition: A | Discharge status: Home / Self Care | Payer: Self-pay | Attending: Emergency Medicine | Admitting: Emergency Medicine

## 2024-03-12 DIAGNOSIS — S8002XA Contusion of left knee, initial encounter: Secondary | ICD-10-CM

## 2024-03-12 LAB — BASIC METABOLIC PANEL WITH GFR
Anion gap: 9 (ref 5–15)
BUN: 15 mg/dL (ref 6–20)
CO2: 27 mmol/L (ref 22–32)
Calcium: 9.3 mg/dL (ref 8.9–10.3)
Chloride: 102 mmol/L (ref 98–111)
Creatinine, Ser: 0.98 mg/dL (ref 0.61–1.24)
GFR, Estimated: >60 mL/min (ref >60)
Glucose, Bld: 101 mg/dL — ABNORMAL HIGH (ref 70–99)
Potassium: 3.9 mmol/L (ref 3.5–5.1)
Sodium: 139 mmol/L (ref 135–145)

## 2024-03-12 LAB — CBC
HCT: 39.2 % (ref 39.0–52.0)
Hemoglobin: 12.2 g/dL — ABNORMAL LOW (ref 13.0–17.0)
MCH: 24.5 pg — ABNORMAL LOW (ref 26.0–34.0)
MCHC: 31.1 g/dL (ref 30.0–36.0)
MCV: 78.7 fL — ABNORMAL LOW (ref 80.0–100.0)
Platelets: 220 10*3/uL (ref 150–400)
RBC: 4.98 MIL/uL (ref 4.22–5.81)
RDW: 13.7 % (ref 11.5–15.5)
WBC: 8.5 10*3/uL (ref 4.0–10.5)
nRBC: 0 % (ref 0.0–0.2)

## 2024-03-12 LAB — D-DIMER, QUANTITATIVE: D-Dimer, Quant: 0.61 ug{FEU}/mL — ABNORMAL HIGH (ref 0.00–0.50)

## 2024-03-12 MED ORDER — ACETAMINOPHEN 325 MG PO TABS
650.0000 mg | ORAL_TABLET | Freq: Once | ORAL | Status: AC
  Administered 2024-03-12: 650 mg via ORAL
  Filled 2024-03-12: qty 2

## 2024-03-12 MED ORDER — NAPROXEN 250 MG PO TABS
500.0000 mg | ORAL_TABLET | Freq: Once | ORAL | Status: AC
  Administered 2024-03-12: 500 mg via ORAL
  Filled 2024-03-12: qty 2

## 2024-03-12 MED ORDER — NAPROXEN 500 MG PO TABS
500.0000 mg | ORAL_TABLET | Freq: Two times a day (BID) | ORAL | 0 refills | Status: AC
  Filled 2024-03-12: qty 30, 15d supply, fill #0

## 2024-03-12 MED ORDER — ACETAMINOPHEN ER 650 MG PO TBCR
650.0000 mg | EXTENDED_RELEASE_TABLET | Freq: Three times a day (TID) | ORAL | 0 refills | Status: AC | PRN
  Filled 2024-03-12: qty 100, 34d supply, fill #0

## 2024-03-12 MED ORDER — OXYCODONE-ACETAMINOPHEN 5-325 MG PO TABS
1.0000 | ORAL_TABLET | Freq: Once | ORAL | Status: AC
  Administered 2024-03-12: 1 via ORAL
  Filled 2024-03-12: qty 1
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
## 2024-03-22 ENCOUNTER — Other Ambulatory Visit (HOSPITAL_BASED_OUTPATIENT_CLINIC_OR_DEPARTMENT_OTHER): Payer: Self-pay
